# Patient Record
Sex: Male | Born: 1935 | Race: White | Hispanic: No | Marital: Married | Smoking: Never smoker
Health system: Southern US, Community
[De-identification: ages and names within clinical notes are randomized; demographics above are authoritative.]

## PROBLEM LIST (undated history)

## (undated) DIAGNOSIS — I10 Essential (primary) hypertension: Secondary | ICD-10-CM

## (undated) DIAGNOSIS — I493 Ventricular premature depolarization: Secondary | ICD-10-CM

## (undated) DIAGNOSIS — E7800 Pure hypercholesterolemia, unspecified: Secondary | ICD-10-CM

## (undated) HISTORY — PX: FOOT SURGERY: SHX648

## (undated) HISTORY — DX: Ventricular premature depolarization: I49.3

## (undated) HISTORY — DX: Essential (primary) hypertension: I10

## (undated) HISTORY — DX: Pure hypercholesterolemia, unspecified: E78.00

---
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
## 1966-05-21 HISTORY — PX: VASECTOMY: SHX75

## 1994-05-21 HISTORY — PX: ROTATOR CUFF REPAIR: SHX139

## 2001-05-21 DIAGNOSIS — M5412 Radiculopathy, cervical region: Secondary | ICD-10-CM

## 2001-05-21 DIAGNOSIS — M47814 Spondylosis without myelopathy or radiculopathy, thoracic region: Secondary | ICD-10-CM | POA: Insufficient documentation

## 2001-05-21 HISTORY — DX: Radiculopathy, cervical region: M54.12

## 2003-06-04 ENCOUNTER — Encounter: Admission: RE | Admit: 2003-06-04 | Discharge: 2003-06-04 | Payer: Self-pay | Admitting: Internal Medicine

## 2003-07-08 ENCOUNTER — Encounter: Admission: RE | Admit: 2003-07-08 | Discharge: 2003-07-08 | Payer: Self-pay | Admitting: Neurosurgery

## 2003-07-23 ENCOUNTER — Encounter: Admission: RE | Admit: 2003-07-23 | Discharge: 2003-07-23 | Payer: Self-pay | Admitting: Neurosurgery

## 2004-05-21 DIAGNOSIS — Z9889 Other specified postprocedural states: Secondary | ICD-10-CM | POA: Insufficient documentation

## 2004-05-21 DIAGNOSIS — M48061 Spinal stenosis, lumbar region without neurogenic claudication: Secondary | ICD-10-CM | POA: Insufficient documentation

## 2004-05-21 DIAGNOSIS — M5416 Radiculopathy, lumbar region: Secondary | ICD-10-CM

## 2004-05-21 HISTORY — DX: Radiculopathy, lumbar region: M54.16

## 2004-05-21 HISTORY — DX: Other specified postprocedural states: Z98.890

## 2009-02-01 ENCOUNTER — Encounter: Admission: RE | Admit: 2009-02-01 | Discharge: 2009-02-01 | Payer: Self-pay | Admitting: Internal Medicine

## 2009-02-23 ENCOUNTER — Encounter: Admission: RE | Admit: 2009-02-23 | Discharge: 2009-02-23 | Payer: Self-pay | Admitting: Neurosurgery

## 2009-03-11 ENCOUNTER — Encounter: Admission: RE | Admit: 2009-03-11 | Discharge: 2009-03-11 | Payer: Self-pay | Admitting: Neurosurgery

## 2009-05-21 HISTORY — PX: CERVICAL DISCECTOMY: SHX98

## 2009-05-27 ENCOUNTER — Inpatient Hospital Stay (HOSPITAL_COMMUNITY): Admission: RE | Admit: 2009-05-27 | Discharge: 2009-05-28 | Payer: Self-pay | Admitting: Neurosurgery

## 2010-05-21 HISTORY — PX: ELBOW ARTHROPLASTY: SHX928

## 2010-08-06 LAB — COMPREHENSIVE METABOLIC PANEL
ALT: 53 U/L (ref 0–53)
AST: 39 U/L — ABNORMAL HIGH (ref 0–37)
Albumin: 4 g/dL (ref 3.5–5.2)
Alkaline Phosphatase: 73 U/L (ref 39–117)
BUN: 10 mg/dL (ref 6–23)
CO2: 29 mEq/L (ref 19–32)
Calcium: 9 mg/dL (ref 8.4–10.5)
Chloride: 102 mEq/L (ref 96–112)
Creatinine, Ser: 1 mg/dL (ref 0.4–1.5)
GFR calc Af Amer: >60 mL/min (ref >60)
GFR calc non Af Amer: >60 mL/min (ref >60)
Glucose, Bld: 108 mg/dL — ABNORMAL HIGH (ref 70–99)
Potassium: 3.6 mEq/L (ref 3.5–5.1)
Sodium: 138 mEq/L (ref 135–145)
Total Bilirubin: 1.5 mg/dL — ABNORMAL HIGH (ref 0.3–1.2)
Total Protein: 6.5 g/dL (ref 6.0–8.3)

## 2010-08-06 LAB — DIFFERENTIAL
Basophils Absolute: 0 10*3/uL (ref 0.0–0.1)
Basophils Relative: 1 % (ref 0–1)
Eosinophils Absolute: 0.2 10*3/uL (ref 0.0–0.7)
Eosinophils Relative: 2 % (ref 0–5)
Lymphocytes Relative: 24 % (ref 12–46)
Lymphs Abs: 2 10*3/uL (ref 0.7–4.0)
Monocytes Absolute: 0.7 10*3/uL (ref 0.1–1.0)
Monocytes Relative: 8 % (ref 3–12)
Neutro Abs: 5.5 10*3/uL (ref 1.7–7.7)
Neutrophils Relative %: 65 % (ref 43–77)

## 2010-08-06 LAB — CBC
HCT: 45.3 % (ref 39.0–52.0)
Hemoglobin: 15.7 g/dL (ref 13.0–17.0)
MCHC: 34.7 g/dL (ref 30.0–36.0)
MCV: 89.7 fL (ref 78.0–100.0)
Platelets: 158 10*3/uL (ref 150–400)
RBC: 5.05 MIL/uL (ref 4.22–5.81)
RDW: 13.9 % (ref 11.5–15.5)
WBC: 8.4 10*3/uL (ref 4.0–10.5)

## 2010-08-06 LAB — URINALYSIS, ROUTINE W REFLEX MICROSCOPIC
Bilirubin Urine: NEGATIVE
Glucose, UA: NEGATIVE mg/dL
Hgb urine dipstick: NEGATIVE
Ketones, ur: NEGATIVE mg/dL
Nitrite: NEGATIVE
Protein, ur: NEGATIVE mg/dL
Specific Gravity, Urine: 1.016 (ref 1.005–1.030)
Urobilinogen, UA: 1 mg/dL (ref 0.0–1.0)
pH: 7 (ref 5.0–8.0)

## 2010-08-06 LAB — PROTIME-INR
INR: 0.99 (ref 0.00–1.49)
Prothrombin Time: 13 seconds (ref 11.6–15.2)

## 2010-08-06 LAB — APTT: aPTT: 31 seconds (ref 24–37)

## 2011-05-28 DIAGNOSIS — M75110 Incomplete rotator cuff tear or rupture of unspecified shoulder, not specified as traumatic: Secondary | ICD-10-CM | POA: Diagnosis not present

## 2011-06-28 DIAGNOSIS — R002 Palpitations: Secondary | ICD-10-CM | POA: Diagnosis not present

## 2011-07-02 DIAGNOSIS — R002 Palpitations: Secondary | ICD-10-CM | POA: Diagnosis not present

## 2011-07-05 DIAGNOSIS — R002 Palpitations: Secondary | ICD-10-CM | POA: Diagnosis not present

## 2011-07-23 DIAGNOSIS — G479 Sleep disorder, unspecified: Secondary | ICD-10-CM | POA: Diagnosis not present

## 2011-07-23 DIAGNOSIS — R002 Palpitations: Secondary | ICD-10-CM | POA: Diagnosis not present

## 2011-08-06 DIAGNOSIS — G4730 Sleep apnea, unspecified: Secondary | ICD-10-CM

## 2011-08-06 DIAGNOSIS — G4733 Obstructive sleep apnea (adult) (pediatric): Secondary | ICD-10-CM | POA: Insufficient documentation

## 2011-08-06 HISTORY — DX: Sleep apnea, unspecified: G47.30

## 2011-08-30 DIAGNOSIS — L905 Scar conditions and fibrosis of skin: Secondary | ICD-10-CM | POA: Diagnosis not present

## 2011-08-30 DIAGNOSIS — L578 Other skin changes due to chronic exposure to nonionizing radiation: Secondary | ICD-10-CM | POA: Diagnosis not present

## 2011-08-30 DIAGNOSIS — D235 Other benign neoplasm of skin of trunk: Secondary | ICD-10-CM | POA: Diagnosis not present

## 2011-08-30 DIAGNOSIS — D485 Neoplasm of uncertain behavior of skin: Secondary | ICD-10-CM | POA: Diagnosis not present

## 2011-10-10 DIAGNOSIS — G4733 Obstructive sleep apnea (adult) (pediatric): Secondary | ICD-10-CM | POA: Diagnosis not present

## 2011-10-10 DIAGNOSIS — M6200 Separation of muscle (nontraumatic), unspecified site: Secondary | ICD-10-CM | POA: Diagnosis not present

## 2011-10-10 DIAGNOSIS — K429 Umbilical hernia without obstruction or gangrene: Secondary | ICD-10-CM | POA: Diagnosis not present

## 2011-11-21 DIAGNOSIS — Z1331 Encounter for screening for depression: Secondary | ICD-10-CM | POA: Diagnosis not present

## 2011-11-21 DIAGNOSIS — G4733 Obstructive sleep apnea (adult) (pediatric): Secondary | ICD-10-CM | POA: Diagnosis not present

## 2011-11-21 DIAGNOSIS — I4949 Other premature depolarization: Secondary | ICD-10-CM | POA: Diagnosis not present

## 2011-11-21 DIAGNOSIS — E7800 Pure hypercholesterolemia, unspecified: Secondary | ICD-10-CM | POA: Diagnosis not present

## 2011-11-21 DIAGNOSIS — Z131 Encounter for screening for diabetes mellitus: Secondary | ICD-10-CM | POA: Diagnosis not present

## 2011-11-21 DIAGNOSIS — Z0000 Encounter for general adult medical examination without abnormal findings: Secondary | ICD-10-CM | POA: Diagnosis not present

## 2012-02-19 DIAGNOSIS — IMO0002 Reserved for concepts with insufficient information to code with codable children: Secondary | ICD-10-CM | POA: Diagnosis not present

## 2012-04-02 DIAGNOSIS — L820 Inflamed seborrheic keratosis: Secondary | ICD-10-CM | POA: Diagnosis not present

## 2012-04-02 DIAGNOSIS — D485 Neoplasm of uncertain behavior of skin: Secondary | ICD-10-CM | POA: Diagnosis not present

## 2012-05-06 DIAGNOSIS — N1330 Unspecified hydronephrosis: Secondary | ICD-10-CM | POA: Diagnosis not present

## 2012-05-06 DIAGNOSIS — I708 Atherosclerosis of other arteries: Secondary | ICD-10-CM | POA: Diagnosis not present

## 2012-05-06 DIAGNOSIS — I700 Atherosclerosis of aorta: Secondary | ICD-10-CM | POA: Diagnosis not present

## 2012-05-06 DIAGNOSIS — R1032 Left lower quadrant pain: Secondary | ICD-10-CM | POA: Diagnosis not present

## 2012-05-06 DIAGNOSIS — N400 Enlarged prostate without lower urinary tract symptoms: Secondary | ICD-10-CM | POA: Diagnosis not present

## 2012-05-06 DIAGNOSIS — N201 Calculus of ureter: Secondary | ICD-10-CM | POA: Diagnosis not present

## 2012-05-06 DIAGNOSIS — G4733 Obstructive sleep apnea (adult) (pediatric): Secondary | ICD-10-CM | POA: Diagnosis not present

## 2012-05-06 DIAGNOSIS — I4460 Unspecified fascicular block: Secondary | ICD-10-CM | POA: Diagnosis not present

## 2012-05-06 DIAGNOSIS — I491 Atrial premature depolarization: Secondary | ICD-10-CM | POA: Diagnosis not present

## 2012-05-06 DIAGNOSIS — M47814 Spondylosis without myelopathy or radiculopathy, thoracic region: Secondary | ICD-10-CM | POA: Diagnosis not present

## 2012-05-06 DIAGNOSIS — M47817 Spondylosis without myelopathy or radiculopathy, lumbosacral region: Secondary | ICD-10-CM | POA: Diagnosis not present

## 2012-05-06 DIAGNOSIS — K5730 Diverticulosis of large intestine without perforation or abscess without bleeding: Secondary | ICD-10-CM | POA: Diagnosis not present

## 2012-05-06 DIAGNOSIS — K4090 Unilateral inguinal hernia, without obstruction or gangrene, not specified as recurrent: Secondary | ICD-10-CM | POA: Diagnosis not present

## 2012-05-07 DIAGNOSIS — N200 Calculus of kidney: Secondary | ICD-10-CM | POA: Diagnosis not present

## 2012-05-08 DIAGNOSIS — N400 Enlarged prostate without lower urinary tract symptoms: Secondary | ICD-10-CM | POA: Diagnosis not present

## 2012-05-08 DIAGNOSIS — N201 Calculus of ureter: Secondary | ICD-10-CM | POA: Diagnosis not present

## 2012-05-21 DIAGNOSIS — N200 Calculus of kidney: Secondary | ICD-10-CM | POA: Insufficient documentation

## 2012-05-21 DIAGNOSIS — N1330 Unspecified hydronephrosis: Secondary | ICD-10-CM | POA: Insufficient documentation

## 2012-05-21 HISTORY — DX: Calculus of kidney: N20.0

## 2012-05-29 DIAGNOSIS — N201 Calculus of ureter: Secondary | ICD-10-CM | POA: Diagnosis not present

## 2012-05-29 DIAGNOSIS — N400 Enlarged prostate without lower urinary tract symptoms: Secondary | ICD-10-CM | POA: Diagnosis not present

## 2012-05-29 DIAGNOSIS — N200 Calculus of kidney: Secondary | ICD-10-CM | POA: Diagnosis not present

## 2012-11-24 DIAGNOSIS — G4733 Obstructive sleep apnea (adult) (pediatric): Secondary | ICD-10-CM | POA: Diagnosis not present

## 2012-11-24 DIAGNOSIS — Z0000 Encounter for general adult medical examination without abnormal findings: Secondary | ICD-10-CM | POA: Diagnosis not present

## 2012-11-24 DIAGNOSIS — Z131 Encounter for screening for diabetes mellitus: Secondary | ICD-10-CM | POA: Diagnosis not present

## 2012-11-24 DIAGNOSIS — N529 Male erectile dysfunction, unspecified: Secondary | ICD-10-CM | POA: Diagnosis not present

## 2012-11-24 DIAGNOSIS — E7800 Pure hypercholesterolemia, unspecified: Secondary | ICD-10-CM | POA: Diagnosis not present

## 2012-11-24 DIAGNOSIS — Z1331 Encounter for screening for depression: Secondary | ICD-10-CM | POA: Diagnosis not present

## 2012-12-29 DIAGNOSIS — H2510 Age-related nuclear cataract, unspecified eye: Secondary | ICD-10-CM | POA: Diagnosis not present

## 2013-05-21 HISTORY — PX: ROTATOR CUFF REPAIR: SHX139

## 2013-11-16 DIAGNOSIS — D235 Other benign neoplasm of skin of trunk: Secondary | ICD-10-CM | POA: Diagnosis not present

## 2013-11-16 DIAGNOSIS — L570 Actinic keratosis: Secondary | ICD-10-CM | POA: Diagnosis not present

## 2013-11-16 DIAGNOSIS — D485 Neoplasm of uncertain behavior of skin: Secondary | ICD-10-CM | POA: Diagnosis not present

## 2013-11-16 DIAGNOSIS — D1801 Hemangioma of skin and subcutaneous tissue: Secondary | ICD-10-CM | POA: Diagnosis not present

## 2013-12-22 DIAGNOSIS — G4733 Obstructive sleep apnea (adult) (pediatric): Secondary | ICD-10-CM | POA: Diagnosis not present

## 2013-12-22 DIAGNOSIS — E7800 Pure hypercholesterolemia, unspecified: Secondary | ICD-10-CM | POA: Diagnosis not present

## 2013-12-22 DIAGNOSIS — Z1331 Encounter for screening for depression: Secondary | ICD-10-CM | POA: Diagnosis not present

## 2013-12-22 DIAGNOSIS — N529 Male erectile dysfunction, unspecified: Secondary | ICD-10-CM | POA: Diagnosis not present

## 2013-12-22 DIAGNOSIS — Z23 Encounter for immunization: Secondary | ICD-10-CM | POA: Diagnosis not present

## 2013-12-22 DIAGNOSIS — Z0000 Encounter for general adult medical examination without abnormal findings: Secondary | ICD-10-CM | POA: Diagnosis not present

## 2013-12-22 DIAGNOSIS — Z131 Encounter for screening for diabetes mellitus: Secondary | ICD-10-CM | POA: Diagnosis not present

## 2014-04-19 DIAGNOSIS — H00012 Hordeolum externum right lower eyelid: Secondary | ICD-10-CM | POA: Diagnosis not present

## 2014-05-31 ENCOUNTER — Other Ambulatory Visit: Payer: Self-pay | Admitting: Internal Medicine

## 2014-05-31 DIAGNOSIS — G4733 Obstructive sleep apnea (adult) (pediatric): Secondary | ICD-10-CM | POA: Diagnosis not present

## 2014-05-31 DIAGNOSIS — M5416 Radiculopathy, lumbar region: Secondary | ICD-10-CM

## 2014-05-31 DIAGNOSIS — M545 Low back pain: Secondary | ICD-10-CM | POA: Diagnosis not present

## 2014-06-06 ENCOUNTER — Ambulatory Visit
Admission: RE | Admit: 2014-06-06 | Discharge: 2014-06-06 | Disposition: A | Discharge status: Home / Self Care | Payer: Medicare Other | Source: Ambulatory Visit | Attending: Internal Medicine | Admitting: Internal Medicine

## 2014-06-06 DIAGNOSIS — M47816 Spondylosis without myelopathy or radiculopathy, lumbar region: Secondary | ICD-10-CM | POA: Diagnosis not present

## 2014-06-06 DIAGNOSIS — M5416 Radiculopathy, lumbar region: Secondary | ICD-10-CM

## 2014-06-21 DIAGNOSIS — H25013 Cortical age-related cataract, bilateral: Secondary | ICD-10-CM | POA: Diagnosis not present

## 2014-06-21 DIAGNOSIS — H2513 Age-related nuclear cataract, bilateral: Secondary | ICD-10-CM | POA: Diagnosis not present

## 2014-06-21 DIAGNOSIS — H01012 Ulcerative blepharitis right lower eyelid: Secondary | ICD-10-CM | POA: Diagnosis not present

## 2014-07-12 DIAGNOSIS — H01012 Ulcerative blepharitis right lower eyelid: Secondary | ICD-10-CM | POA: Diagnosis not present

## 2014-08-10 DIAGNOSIS — H01015 Ulcerative blepharitis left lower eyelid: Secondary | ICD-10-CM | POA: Diagnosis not present

## 2014-08-10 DIAGNOSIS — H01012 Ulcerative blepharitis right lower eyelid: Secondary | ICD-10-CM | POA: Diagnosis not present

## 2014-08-20 DIAGNOSIS — M47816 Spondylosis without myelopathy or radiculopathy, lumbar region: Secondary | ICD-10-CM | POA: Diagnosis not present

## 2014-09-17 DIAGNOSIS — M545 Low back pain: Secondary | ICD-10-CM | POA: Diagnosis not present

## 2014-09-17 DIAGNOSIS — M47816 Spondylosis without myelopathy or radiculopathy, lumbar region: Secondary | ICD-10-CM | POA: Diagnosis not present

## 2014-09-20 DIAGNOSIS — G8929 Other chronic pain: Secondary | ICD-10-CM | POA: Diagnosis not present

## 2014-09-20 DIAGNOSIS — Z885 Allergy status to narcotic agent status: Secondary | ICD-10-CM | POA: Diagnosis not present

## 2014-09-20 DIAGNOSIS — M47816 Spondylosis without myelopathy or radiculopathy, lumbar region: Secondary | ICD-10-CM | POA: Diagnosis not present

## 2014-09-20 DIAGNOSIS — Z79899 Other long term (current) drug therapy: Secondary | ICD-10-CM | POA: Diagnosis not present

## 2014-09-20 DIAGNOSIS — Z7982 Long term (current) use of aspirin: Secondary | ICD-10-CM | POA: Diagnosis not present

## 2014-09-20 DIAGNOSIS — Z888 Allergy status to other drugs, medicaments and biological substances status: Secondary | ICD-10-CM | POA: Diagnosis not present

## 2014-10-14 DIAGNOSIS — Z79899 Other long term (current) drug therapy: Secondary | ICD-10-CM | POA: Diagnosis not present

## 2014-10-14 DIAGNOSIS — Z7982 Long term (current) use of aspirin: Secondary | ICD-10-CM | POA: Diagnosis not present

## 2014-10-14 DIAGNOSIS — M47816 Spondylosis without myelopathy or radiculopathy, lumbar region: Secondary | ICD-10-CM | POA: Diagnosis not present

## 2014-10-14 DIAGNOSIS — G8929 Other chronic pain: Secondary | ICD-10-CM | POA: Diagnosis not present

## 2014-10-14 DIAGNOSIS — Z885 Allergy status to narcotic agent status: Secondary | ICD-10-CM | POA: Diagnosis not present

## 2014-10-14 DIAGNOSIS — Z888 Allergy status to other drugs, medicaments and biological substances status: Secondary | ICD-10-CM | POA: Diagnosis not present

## 2014-10-19 DIAGNOSIS — M47816 Spondylosis without myelopathy or radiculopathy, lumbar region: Secondary | ICD-10-CM | POA: Diagnosis not present

## 2014-10-20 DIAGNOSIS — L821 Other seborrheic keratosis: Secondary | ICD-10-CM | POA: Diagnosis not present

## 2014-10-20 DIAGNOSIS — L820 Inflamed seborrheic keratosis: Secondary | ICD-10-CM | POA: Diagnosis not present

## 2014-10-20 DIAGNOSIS — L700 Acne vulgaris: Secondary | ICD-10-CM | POA: Diagnosis not present

## 2014-10-20 DIAGNOSIS — D225 Melanocytic nevi of trunk: Secondary | ICD-10-CM | POA: Diagnosis not present

## 2014-10-20 DIAGNOSIS — L570 Actinic keratosis: Secondary | ICD-10-CM | POA: Diagnosis not present

## 2014-11-17 DIAGNOSIS — M47816 Spondylosis without myelopathy or radiculopathy, lumbar region: Secondary | ICD-10-CM | POA: Diagnosis not present

## 2014-11-17 DIAGNOSIS — M545 Low back pain: Secondary | ICD-10-CM | POA: Diagnosis not present

## 2014-12-09 DIAGNOSIS — H01015 Ulcerative blepharitis left lower eyelid: Secondary | ICD-10-CM | POA: Diagnosis not present

## 2014-12-09 DIAGNOSIS — H01012 Ulcerative blepharitis right lower eyelid: Secondary | ICD-10-CM | POA: Diagnosis not present

## 2015-01-13 DIAGNOSIS — M47816 Spondylosis without myelopathy or radiculopathy, lumbar region: Secondary | ICD-10-CM | POA: Diagnosis not present

## 2015-01-13 DIAGNOSIS — Z885 Allergy status to narcotic agent status: Secondary | ICD-10-CM | POA: Diagnosis not present

## 2015-01-13 DIAGNOSIS — M545 Low back pain: Secondary | ICD-10-CM | POA: Diagnosis not present

## 2015-01-13 DIAGNOSIS — Z888 Allergy status to other drugs, medicaments and biological substances status: Secondary | ICD-10-CM | POA: Diagnosis not present

## 2015-01-13 DIAGNOSIS — Z7982 Long term (current) use of aspirin: Secondary | ICD-10-CM | POA: Diagnosis not present

## 2015-01-13 DIAGNOSIS — G8929 Other chronic pain: Secondary | ICD-10-CM | POA: Diagnosis not present

## 2015-02-04 DIAGNOSIS — M47816 Spondylosis without myelopathy or radiculopathy, lumbar region: Secondary | ICD-10-CM | POA: Diagnosis not present

## 2015-02-04 DIAGNOSIS — Z885 Allergy status to narcotic agent status: Secondary | ICD-10-CM | POA: Diagnosis not present

## 2015-02-04 DIAGNOSIS — Z888 Allergy status to other drugs, medicaments and biological substances status: Secondary | ICD-10-CM | POA: Diagnosis not present

## 2015-02-04 DIAGNOSIS — M545 Low back pain: Secondary | ICD-10-CM | POA: Diagnosis not present

## 2015-02-04 DIAGNOSIS — Z7982 Long term (current) use of aspirin: Secondary | ICD-10-CM | POA: Diagnosis not present

## 2015-02-04 DIAGNOSIS — G8929 Other chronic pain: Secondary | ICD-10-CM | POA: Diagnosis not present

## 2015-02-21 DIAGNOSIS — G4733 Obstructive sleep apnea (adult) (pediatric): Secondary | ICD-10-CM | POA: Diagnosis not present

## 2015-02-21 DIAGNOSIS — Z0000 Encounter for general adult medical examination without abnormal findings: Secondary | ICD-10-CM | POA: Diagnosis not present

## 2015-02-21 DIAGNOSIS — Z1389 Encounter for screening for other disorder: Secondary | ICD-10-CM | POA: Diagnosis not present

## 2015-02-21 DIAGNOSIS — M545 Low back pain: Secondary | ICD-10-CM | POA: Diagnosis not present

## 2015-02-21 DIAGNOSIS — E782 Mixed hyperlipidemia: Secondary | ICD-10-CM | POA: Diagnosis not present

## 2015-02-23 DIAGNOSIS — M47816 Spondylosis without myelopathy or radiculopathy, lumbar region: Secondary | ICD-10-CM | POA: Diagnosis not present

## 2015-02-23 DIAGNOSIS — M545 Low back pain: Secondary | ICD-10-CM | POA: Diagnosis not present

## 2015-04-21 DIAGNOSIS — I10 Essential (primary) hypertension: Secondary | ICD-10-CM | POA: Diagnosis not present

## 2015-04-28 DIAGNOSIS — I10 Essential (primary) hypertension: Secondary | ICD-10-CM | POA: Diagnosis not present

## 2015-05-03 DIAGNOSIS — D122 Benign neoplasm of ascending colon: Secondary | ICD-10-CM | POA: Diagnosis not present

## 2015-05-03 DIAGNOSIS — Z09 Encounter for follow-up examination after completed treatment for conditions other than malignant neoplasm: Secondary | ICD-10-CM | POA: Diagnosis not present

## 2015-05-03 DIAGNOSIS — Z86010 Personal history of colonic polyps: Secondary | ICD-10-CM | POA: Diagnosis not present

## 2015-05-03 DIAGNOSIS — D120 Benign neoplasm of cecum: Secondary | ICD-10-CM | POA: Diagnosis not present

## 2015-05-03 DIAGNOSIS — Z1211 Encounter for screening for malignant neoplasm of colon: Secondary | ICD-10-CM | POA: Diagnosis not present

## 2015-05-03 DIAGNOSIS — K5730 Diverticulosis of large intestine without perforation or abscess without bleeding: Secondary | ICD-10-CM | POA: Diagnosis not present

## 2015-05-03 DIAGNOSIS — D126 Benign neoplasm of colon, unspecified: Secondary | ICD-10-CM | POA: Diagnosis not present

## 2015-05-22 HISTORY — PX: CATARACT EXTRACTION, BILATERAL: SHX1313

## 2015-05-30 DIAGNOSIS — B078 Other viral warts: Secondary | ICD-10-CM | POA: Diagnosis not present

## 2015-05-30 DIAGNOSIS — D225 Melanocytic nevi of trunk: Secondary | ICD-10-CM | POA: Diagnosis not present

## 2015-05-30 DIAGNOSIS — D1801 Hemangioma of skin and subcutaneous tissue: Secondary | ICD-10-CM | POA: Diagnosis not present

## 2015-05-30 DIAGNOSIS — L814 Other melanin hyperpigmentation: Secondary | ICD-10-CM | POA: Diagnosis not present

## 2015-05-30 DIAGNOSIS — L821 Other seborrheic keratosis: Secondary | ICD-10-CM | POA: Diagnosis not present

## 2015-05-30 DIAGNOSIS — L570 Actinic keratosis: Secondary | ICD-10-CM | POA: Diagnosis not present

## 2015-06-14 DIAGNOSIS — K1370 Unspecified lesions of oral mucosa: Secondary | ICD-10-CM | POA: Diagnosis not present

## 2015-06-24 DIAGNOSIS — H2513 Age-related nuclear cataract, bilateral: Secondary | ICD-10-CM | POA: Diagnosis not present

## 2015-06-24 DIAGNOSIS — H25013 Cortical age-related cataract, bilateral: Secondary | ICD-10-CM | POA: Diagnosis not present

## 2015-06-24 DIAGNOSIS — H01015 Ulcerative blepharitis left lower eyelid: Secondary | ICD-10-CM | POA: Diagnosis not present

## 2015-06-24 DIAGNOSIS — H01012 Ulcerative blepharitis right lower eyelid: Secondary | ICD-10-CM | POA: Diagnosis not present

## 2015-07-29 DIAGNOSIS — H3563 Retinal hemorrhage, bilateral: Secondary | ICD-10-CM | POA: Diagnosis not present

## 2015-07-29 DIAGNOSIS — H43813 Vitreous degeneration, bilateral: Secondary | ICD-10-CM | POA: Diagnosis not present

## 2015-07-29 DIAGNOSIS — H34832 Tributary (branch) retinal vein occlusion, left eye, with macular edema: Secondary | ICD-10-CM | POA: Diagnosis not present

## 2015-08-24 DIAGNOSIS — M47816 Spondylosis without myelopathy or radiculopathy, lumbar region: Secondary | ICD-10-CM | POA: Diagnosis not present

## 2015-08-24 DIAGNOSIS — M545 Low back pain: Secondary | ICD-10-CM | POA: Diagnosis not present

## 2015-11-02 DIAGNOSIS — M25522 Pain in left elbow: Secondary | ICD-10-CM | POA: Diagnosis not present

## 2015-11-02 DIAGNOSIS — M7022 Olecranon bursitis, left elbow: Secondary | ICD-10-CM | POA: Diagnosis not present

## 2015-11-07 DIAGNOSIS — H34832 Tributary (branch) retinal vein occlusion, left eye, with macular edema: Secondary | ICD-10-CM | POA: Diagnosis not present

## 2015-11-07 DIAGNOSIS — H43813 Vitreous degeneration, bilateral: Secondary | ICD-10-CM | POA: Diagnosis not present

## 2015-11-18 DIAGNOSIS — M7022 Olecranon bursitis, left elbow: Secondary | ICD-10-CM | POA: Diagnosis not present

## 2015-12-05 DIAGNOSIS — G8918 Other acute postprocedural pain: Secondary | ICD-10-CM | POA: Diagnosis not present

## 2015-12-05 DIAGNOSIS — M7022 Olecranon bursitis, left elbow: Secondary | ICD-10-CM | POA: Diagnosis not present

## 2015-12-19 DIAGNOSIS — M7022 Olecranon bursitis, left elbow: Secondary | ICD-10-CM | POA: Diagnosis not present

## 2015-12-19 DIAGNOSIS — Z4789 Encounter for other orthopedic aftercare: Secondary | ICD-10-CM | POA: Diagnosis not present

## 2016-01-03 DIAGNOSIS — M7022 Olecranon bursitis, left elbow: Secondary | ICD-10-CM | POA: Diagnosis not present

## 2016-01-03 DIAGNOSIS — Z4789 Encounter for other orthopedic aftercare: Secondary | ICD-10-CM | POA: Diagnosis not present

## 2016-01-13 DIAGNOSIS — H2513 Age-related nuclear cataract, bilateral: Secondary | ICD-10-CM | POA: Diagnosis not present

## 2016-02-03 DIAGNOSIS — M7022 Olecranon bursitis, left elbow: Secondary | ICD-10-CM | POA: Diagnosis not present

## 2016-02-03 DIAGNOSIS — Z4789 Encounter for other orthopedic aftercare: Secondary | ICD-10-CM | POA: Diagnosis not present

## 2016-02-29 DIAGNOSIS — D1801 Hemangioma of skin and subcutaneous tissue: Secondary | ICD-10-CM | POA: Diagnosis not present

## 2016-02-29 DIAGNOSIS — D225 Melanocytic nevi of trunk: Secondary | ICD-10-CM | POA: Diagnosis not present

## 2016-02-29 DIAGNOSIS — L820 Inflamed seborrheic keratosis: Secondary | ICD-10-CM | POA: Diagnosis not present

## 2016-02-29 DIAGNOSIS — L821 Other seborrheic keratosis: Secondary | ICD-10-CM | POA: Diagnosis not present

## 2016-02-29 DIAGNOSIS — L570 Actinic keratosis: Secondary | ICD-10-CM | POA: Diagnosis not present

## 2016-03-09 DIAGNOSIS — E7800 Pure hypercholesterolemia, unspecified: Secondary | ICD-10-CM | POA: Diagnosis not present

## 2016-03-09 DIAGNOSIS — Z0000 Encounter for general adult medical examination without abnormal findings: Secondary | ICD-10-CM | POA: Diagnosis not present

## 2016-03-09 DIAGNOSIS — Z1389 Encounter for screening for other disorder: Secondary | ICD-10-CM | POA: Diagnosis not present

## 2016-03-09 DIAGNOSIS — I10 Essential (primary) hypertension: Secondary | ICD-10-CM | POA: Diagnosis not present

## 2016-05-10 DIAGNOSIS — H34832 Tributary (branch) retinal vein occlusion, left eye, with macular edema: Secondary | ICD-10-CM | POA: Diagnosis not present

## 2016-05-10 DIAGNOSIS — H43813 Vitreous degeneration, bilateral: Secondary | ICD-10-CM | POA: Diagnosis not present

## 2016-06-26 DIAGNOSIS — H01012 Ulcerative blepharitis right lower eyelid: Secondary | ICD-10-CM | POA: Diagnosis not present

## 2016-06-26 DIAGNOSIS — H01015 Ulcerative blepharitis left lower eyelid: Secondary | ICD-10-CM | POA: Diagnosis not present

## 2016-06-26 DIAGNOSIS — H3561 Retinal hemorrhage, right eye: Secondary | ICD-10-CM | POA: Diagnosis not present

## 2016-06-26 DIAGNOSIS — H2513 Age-related nuclear cataract, bilateral: Secondary | ICD-10-CM | POA: Diagnosis not present

## 2016-07-31 DIAGNOSIS — M545 Low back pain: Secondary | ICD-10-CM | POA: Diagnosis not present

## 2016-07-31 DIAGNOSIS — M47817 Spondylosis without myelopathy or radiculopathy, lumbosacral region: Secondary | ICD-10-CM | POA: Diagnosis not present

## 2016-08-23 DIAGNOSIS — M47817 Spondylosis without myelopathy or radiculopathy, lumbosacral region: Secondary | ICD-10-CM | POA: Diagnosis not present

## 2016-08-23 DIAGNOSIS — M545 Low back pain: Secondary | ICD-10-CM | POA: Diagnosis not present

## 2016-09-12 DIAGNOSIS — D1809 Hemangioma of other sites: Secondary | ICD-10-CM | POA: Diagnosis not present

## 2016-09-20 DIAGNOSIS — D1809 Hemangioma of other sites: Secondary | ICD-10-CM | POA: Diagnosis not present

## 2016-09-25 DIAGNOSIS — M47817 Spondylosis without myelopathy or radiculopathy, lumbosacral region: Secondary | ICD-10-CM | POA: Diagnosis not present

## 2016-09-25 DIAGNOSIS — G894 Chronic pain syndrome: Secondary | ICD-10-CM | POA: Diagnosis not present

## 2016-11-05 DIAGNOSIS — H34832 Tributary (branch) retinal vein occlusion, left eye, with macular edema: Secondary | ICD-10-CM | POA: Diagnosis not present

## 2016-11-05 DIAGNOSIS — H35031 Hypertensive retinopathy, right eye: Secondary | ICD-10-CM | POA: Diagnosis not present

## 2016-11-05 DIAGNOSIS — H43813 Vitreous degeneration, bilateral: Secondary | ICD-10-CM | POA: Diagnosis not present

## 2016-11-05 DIAGNOSIS — H3563 Retinal hemorrhage, bilateral: Secondary | ICD-10-CM | POA: Diagnosis not present

## 2016-11-14 IMAGING — MR MR LUMBAR SPINE W/O CM
5 series · 35 of 48 positions shown · non-contrast
Comparison: Lumbar MRI 02/01/2009.

CLINICAL DATA: 83-year-old male with chronic low back pain, new
acute pain and numbness radiating to the left hip and anterior lower
extremity for 2 months. Initial encounter.

EXAM:
MRI LUMBAR SPINE WITHOUT CONTRAST
TECHNIQUE: Multiplanar, multisequence MR imaging of the lumbar spine was
performed. No intravenous contrast was administered.

[Series 4: T2 · sagittal · 4.0mm · 0.49mm/px · 6 of 13 slices shown (1 of 2)]
[im 1/13]
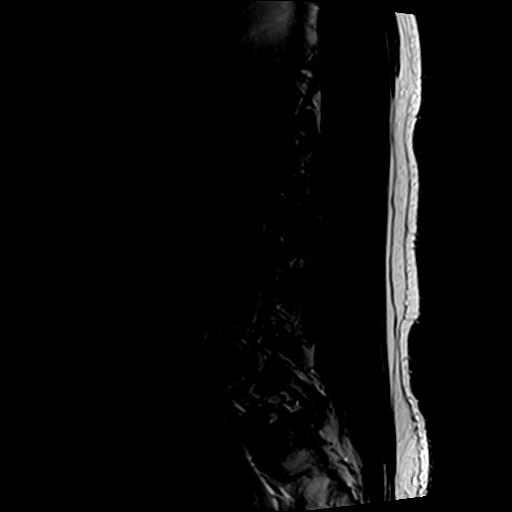
[im 3/13]
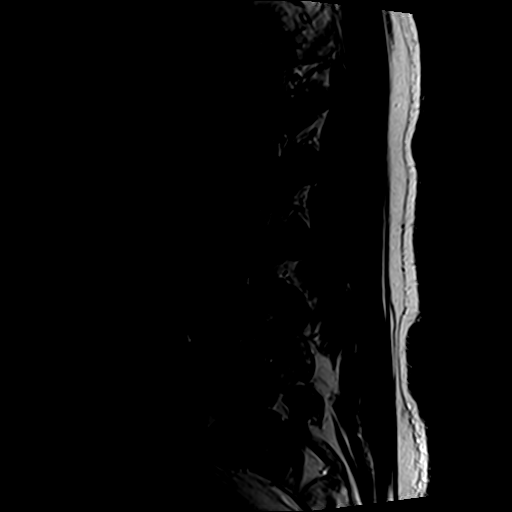
[im 5/13]
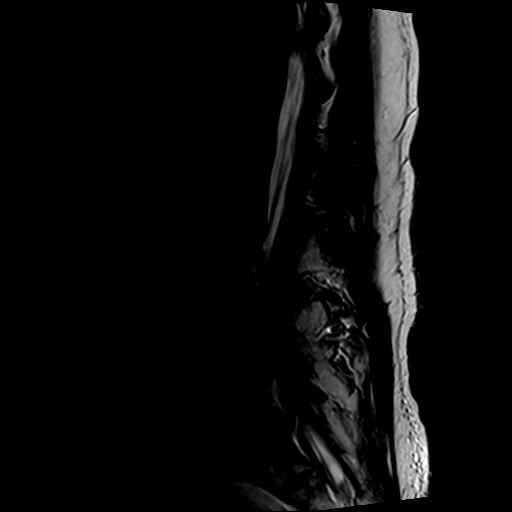
[im 8/13]
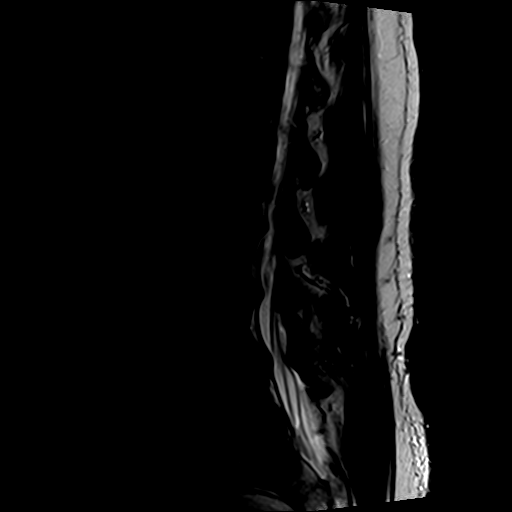
[im 10/13]
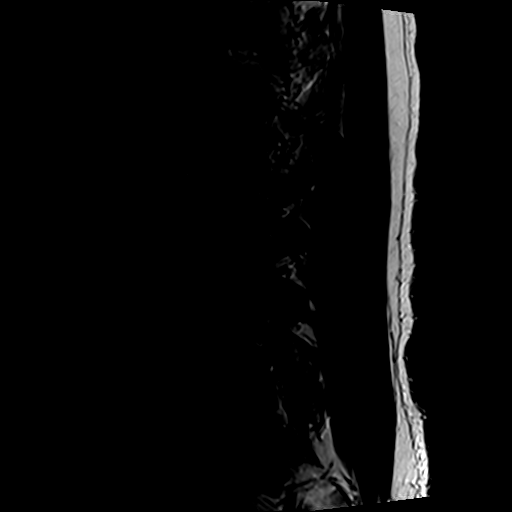
[im 13/13]
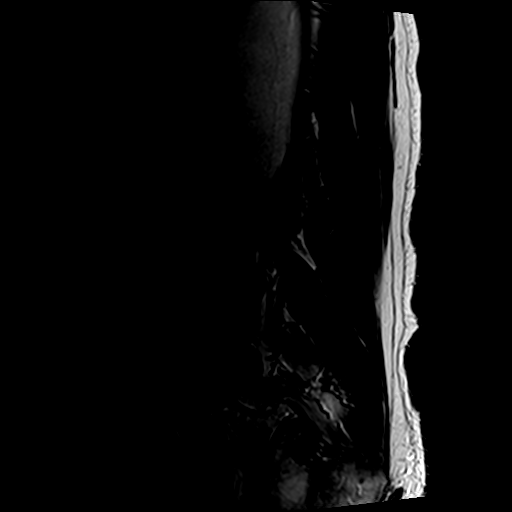

[Series 5: T1 · sagittal · 4.0mm · 0.98mm/px · 6 of 13 slices shown (1 of 2)]
[im 1/13]
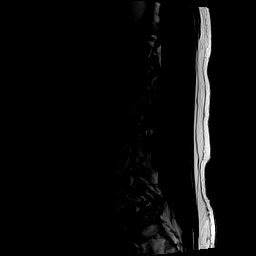
[im 3/13]
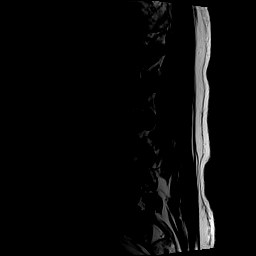
[im 5/13]
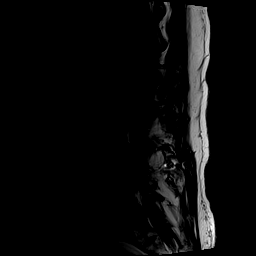
[im 8/13]
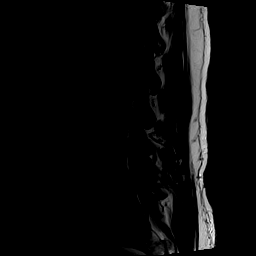
[im 10/13]
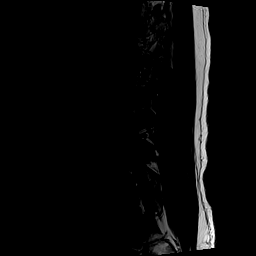
[im 13/13]
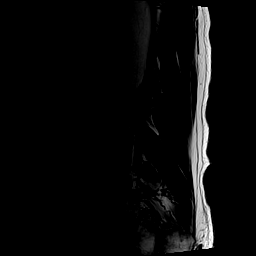

[Series 6: STIR · sagittal · 4.0mm · 0.49mm/px · 5 of 13 slices shown]
[im 1/13]
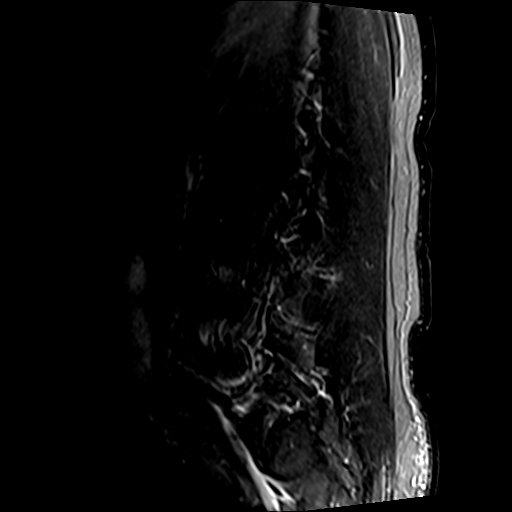
[im 3/13]
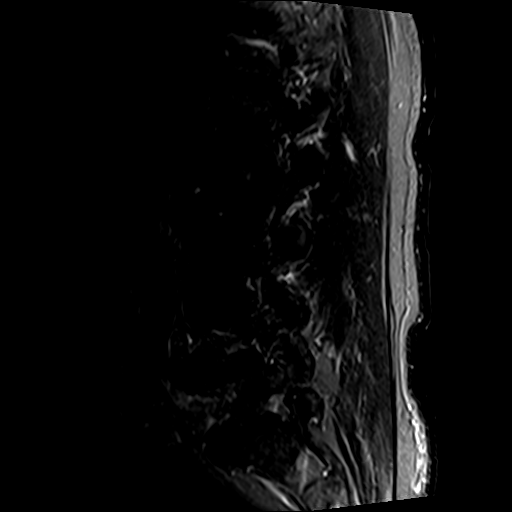
[im 5/13]
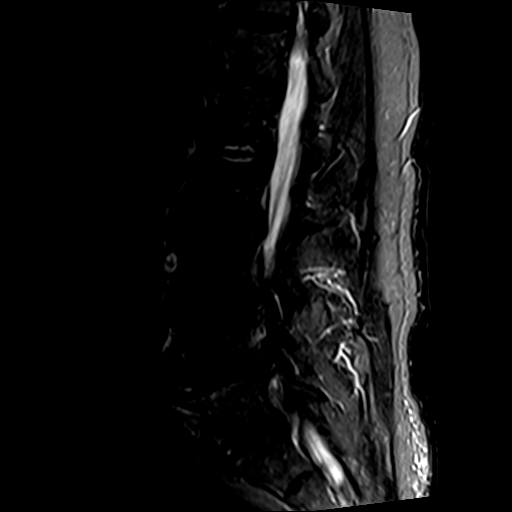
[im 8/13]
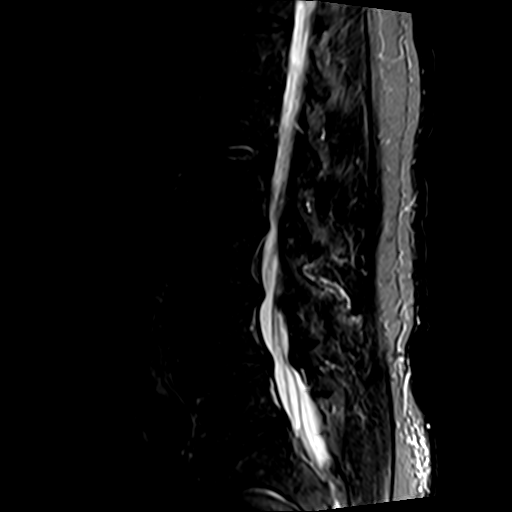
[im 10/13]
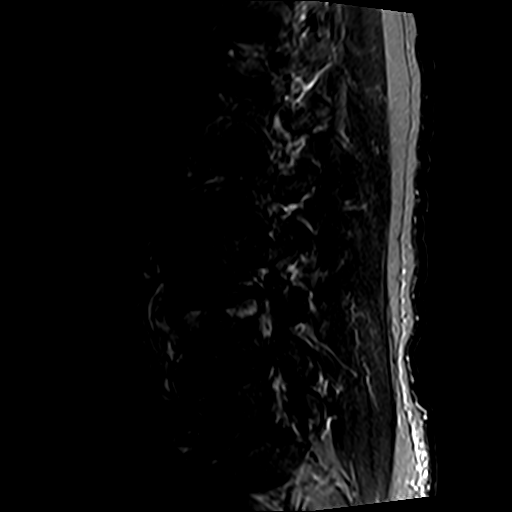

[Series 7: T2 · axial · 4.0mm · 0.74mm/px · z∈[-169,+10]mm · 9 of 32 slices shown (2 of 2)]
[im 1/32]
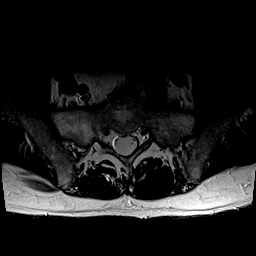
[im 5/32]
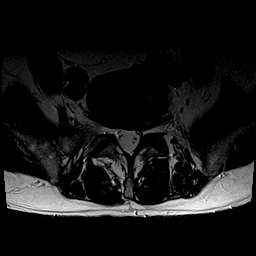
[im 9/32]
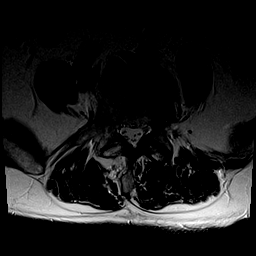
[im 14/32]
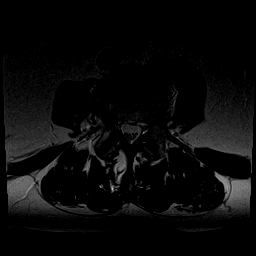
[im 16/32]
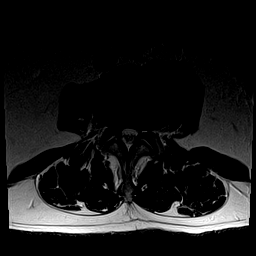
[im 18/32]
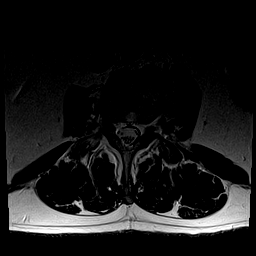
[im 23/32]
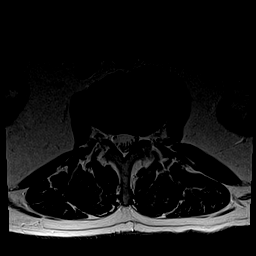
[im 27/32]
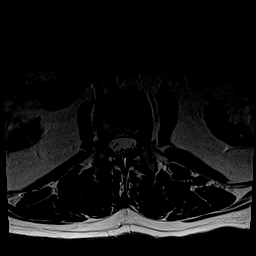
[im 32/32]
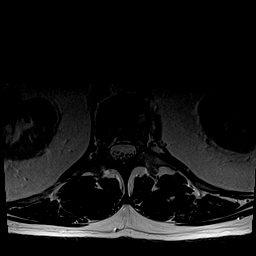

[Series 8: T1 · axial · 4.0mm · 0.74mm/px · z∈[-169,+10]mm · 9 of 32 slices shown (2 of 2)]
[im 1/32]
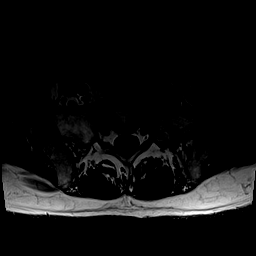
[im 5/32]
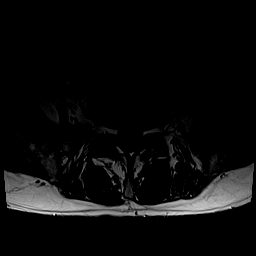
[im 9/32]
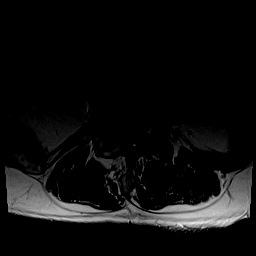
[im 14/32]
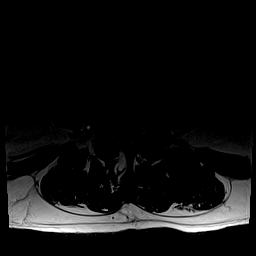
[im 16/32]
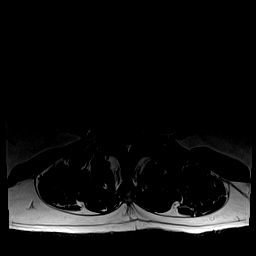
[im 18/32]
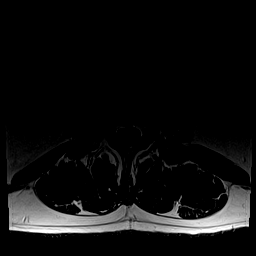
[im 23/32]
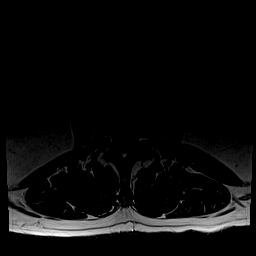
[im 27/32]
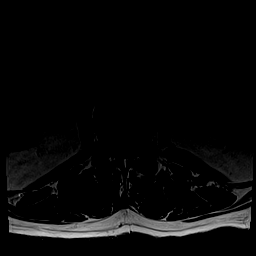
[im 32/32]
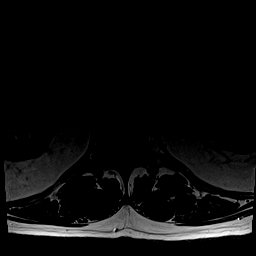

[35 of 48 positions shown; findings below may reference images not displayed]

FINDINGS: Same numbering system as in 8292. Stable vertebral height and
alignment. New mild superior endplate marrow edema at L4 on the left
(series 6, image 10) appears to be degenerative in nature. No acute
osseous abnormality identified.

Visualized lower thoracic spinal cord is normal with conus medularis
at L1.

Stable visualized abdominal viscera. Negative visualized posterior
paraspinal soft tissues.

T11-T12:  Negative.

T12-L1:  Negative.

L1-L2:  Needed.

L2-L3: Stable left eccentric disc osteophyte complex. No significant
stenosis.

L3-L4: Chronic left eccentric circumferential disc osteophyte
complex appears increased and is bulky far laterally. Broad-based
posterior component of disc. Chronic mild facet and ligament flavum
hypertrophy is stable. Still, lateral recess stenosis has increased
and is mild, greater on the right. Mild to moderate bilateral L3
foraminal stenosis has increased.

L4-L5: Interval postoperative changes to the right facet and lamina.
Chronic circumferential disc osteophyte complex. Chronic moderate
facet and ligament flavum hypertrophy. Interval improved thecal sac
and right lateral recess patency. Chronic moderate left and mild
right L4 foraminal stenosis is stable.

L5-S1: Chronic anterior eccentric bulky circumferential disc
osteophyte complex. No spinal or lateral recess stenosis. Stable
mild L5 foraminal stenosis.
IMPRESSION: 1. Postoperative changes on the right at L4-L5 since the 8292
comparison with improved thecal sac and right lateral recess patency
at that level. Residual right greater than left L4 foraminal
stenosis appears stable.
2. Chronic L3-L4 degeneration with interval increased right greater
than left lateral recess and bilateral L3 foraminal stenosis, mild
to moderate.
3. Other lumbar levels are stable.

## 2016-12-11 DIAGNOSIS — H01015 Ulcerative blepharitis left lower eyelid: Secondary | ICD-10-CM | POA: Diagnosis not present

## 2016-12-11 DIAGNOSIS — H01012 Ulcerative blepharitis right lower eyelid: Secondary | ICD-10-CM | POA: Diagnosis not present

## 2016-12-11 DIAGNOSIS — H04123 Dry eye syndrome of bilateral lacrimal glands: Secondary | ICD-10-CM | POA: Diagnosis not present

## 2016-12-11 DIAGNOSIS — H25042 Posterior subcapsular polar age-related cataract, left eye: Secondary | ICD-10-CM | POA: Diagnosis not present

## 2017-01-10 DIAGNOSIS — H25042 Posterior subcapsular polar age-related cataract, left eye: Secondary | ICD-10-CM | POA: Diagnosis not present

## 2017-01-10 DIAGNOSIS — H01012 Ulcerative blepharitis right lower eyelid: Secondary | ICD-10-CM | POA: Diagnosis not present

## 2017-02-06 DIAGNOSIS — G4733 Obstructive sleep apnea (adult) (pediatric): Secondary | ICD-10-CM | POA: Diagnosis not present

## 2017-03-22 ENCOUNTER — Other Ambulatory Visit: Payer: Self-pay | Admitting: Internal Medicine

## 2017-03-22 ENCOUNTER — Ambulatory Visit
Admission: RE | Admit: 2017-03-22 | Discharge: 2017-03-22 | Disposition: A | Discharge status: Home / Self Care | Payer: Medicare Other | Source: Ambulatory Visit | Attending: Internal Medicine | Admitting: Internal Medicine

## 2017-03-22 DIAGNOSIS — R059 Cough, unspecified: Secondary | ICD-10-CM

## 2017-03-22 DIAGNOSIS — I10 Essential (primary) hypertension: Secondary | ICD-10-CM | POA: Diagnosis not present

## 2017-03-22 DIAGNOSIS — Z1389 Encounter for screening for other disorder: Secondary | ICD-10-CM | POA: Diagnosis not present

## 2017-03-22 DIAGNOSIS — R05 Cough: Secondary | ICD-10-CM | POA: Diagnosis not present

## 2017-03-22 DIAGNOSIS — E7800 Pure hypercholesterolemia, unspecified: Secondary | ICD-10-CM | POA: Diagnosis not present

## 2017-03-22 DIAGNOSIS — Z0000 Encounter for general adult medical examination without abnormal findings: Secondary | ICD-10-CM | POA: Diagnosis not present

## 2017-03-28 DIAGNOSIS — L570 Actinic keratosis: Secondary | ICD-10-CM | POA: Diagnosis not present

## 2017-04-23 DIAGNOSIS — H02839 Dermatochalasis of unspecified eye, unspecified eyelid: Secondary | ICD-10-CM | POA: Diagnosis not present

## 2017-04-23 DIAGNOSIS — H2513 Age-related nuclear cataract, bilateral: Secondary | ICD-10-CM | POA: Diagnosis not present

## 2017-04-23 DIAGNOSIS — H25043 Posterior subcapsular polar age-related cataract, bilateral: Secondary | ICD-10-CM | POA: Diagnosis not present

## 2017-04-23 DIAGNOSIS — H2512 Age-related nuclear cataract, left eye: Secondary | ICD-10-CM | POA: Diagnosis not present

## 2017-04-23 DIAGNOSIS — H25013 Cortical age-related cataract, bilateral: Secondary | ICD-10-CM | POA: Diagnosis not present

## 2017-05-07 DIAGNOSIS — G4733 Obstructive sleep apnea (adult) (pediatric): Secondary | ICD-10-CM | POA: Diagnosis not present

## 2017-05-30 DIAGNOSIS — R0902 Hypoxemia: Secondary | ICD-10-CM | POA: Diagnosis not present

## 2017-06-06 DIAGNOSIS — R251 Tremor, unspecified: Secondary | ICD-10-CM | POA: Diagnosis not present

## 2017-06-27 DIAGNOSIS — M47816 Spondylosis without myelopathy or radiculopathy, lumbar region: Secondary | ICD-10-CM | POA: Diagnosis not present

## 2017-07-04 ENCOUNTER — Other Ambulatory Visit: Payer: Self-pay | Admitting: Nurse Practitioner

## 2017-07-04 DIAGNOSIS — M47816 Spondylosis without myelopathy or radiculopathy, lumbar region: Secondary | ICD-10-CM

## 2017-07-06 ENCOUNTER — Ambulatory Visit
Admission: RE | Admit: 2017-07-06 | Discharge: 2017-07-06 | Disposition: A | Discharge status: Home / Self Care | Payer: Medicare Other | Source: Ambulatory Visit | Attending: Nurse Practitioner | Admitting: Nurse Practitioner

## 2017-07-06 DIAGNOSIS — M47816 Spondylosis without myelopathy or radiculopathy, lumbar region: Secondary | ICD-10-CM

## 2017-07-06 DIAGNOSIS — M48061 Spinal stenosis, lumbar region without neurogenic claudication: Secondary | ICD-10-CM | POA: Diagnosis not present

## 2017-07-08 DIAGNOSIS — H52202 Unspecified astigmatism, left eye: Secondary | ICD-10-CM | POA: Diagnosis not present

## 2017-07-08 DIAGNOSIS — H25012 Cortical age-related cataract, left eye: Secondary | ICD-10-CM | POA: Diagnosis not present

## 2017-07-08 DIAGNOSIS — H2512 Age-related nuclear cataract, left eye: Secondary | ICD-10-CM | POA: Diagnosis not present

## 2017-07-09 DIAGNOSIS — H2511 Age-related nuclear cataract, right eye: Secondary | ICD-10-CM | POA: Diagnosis not present

## 2017-07-16 DIAGNOSIS — M47816 Spondylosis without myelopathy or radiculopathy, lumbar region: Secondary | ICD-10-CM | POA: Diagnosis not present

## 2017-07-18 ENCOUNTER — Other Ambulatory Visit: Payer: Self-pay | Admitting: Neurosurgery

## 2017-07-18 DIAGNOSIS — M47816 Spondylosis without myelopathy or radiculopathy, lumbar region: Secondary | ICD-10-CM

## 2017-07-22 DIAGNOSIS — H52201 Unspecified astigmatism, right eye: Secondary | ICD-10-CM | POA: Diagnosis not present

## 2017-07-22 DIAGNOSIS — H2511 Age-related nuclear cataract, right eye: Secondary | ICD-10-CM | POA: Diagnosis not present

## 2017-07-22 DIAGNOSIS — H25011 Cortical age-related cataract, right eye: Secondary | ICD-10-CM | POA: Diagnosis not present

## 2017-07-25 ENCOUNTER — Other Ambulatory Visit: Payer: Medicare Other

## 2017-08-12 DIAGNOSIS — H35033 Hypertensive retinopathy, bilateral: Secondary | ICD-10-CM | POA: Diagnosis not present

## 2017-08-12 DIAGNOSIS — H34832 Tributary (branch) retinal vein occlusion, left eye, with macular edema: Secondary | ICD-10-CM | POA: Diagnosis not present

## 2017-08-12 DIAGNOSIS — H3562 Retinal hemorrhage, left eye: Secondary | ICD-10-CM | POA: Diagnosis not present

## 2017-08-12 DIAGNOSIS — H43813 Vitreous degeneration, bilateral: Secondary | ICD-10-CM | POA: Diagnosis not present

## 2017-08-13 ENCOUNTER — Other Ambulatory Visit: Payer: Self-pay | Admitting: Nurse Practitioner

## 2017-08-13 DIAGNOSIS — M47817 Spondylosis without myelopathy or radiculopathy, lumbosacral region: Secondary | ICD-10-CM

## 2017-08-23 ENCOUNTER — Ambulatory Visit
Admission: RE | Admit: 2017-08-23 | Discharge: 2017-08-23 | Disposition: A | Discharge status: Home / Self Care | Payer: Medicare Other | Source: Ambulatory Visit | Attending: Nurse Practitioner | Admitting: Nurse Practitioner

## 2017-08-23 ENCOUNTER — Other Ambulatory Visit: Payer: Self-pay | Admitting: Nurse Practitioner

## 2017-08-23 DIAGNOSIS — M47817 Spondylosis without myelopathy or radiculopathy, lumbosacral region: Secondary | ICD-10-CM

## 2017-08-23 DIAGNOSIS — M545 Low back pain: Secondary | ICD-10-CM | POA: Diagnosis not present

## 2017-08-23 NOTE — Discharge Instructions (Signed)

## 2017-08-27 NOTE — Discharge Instructions (Signed)
Radio Frequency Ablation Post Procedure Discharge Instructions ° °1. May resume a regular diet and any medications that you routinely take (including pain medications). °2. No driving day of procedure. °3. Upon discharge go home and rest for at least 4 hours.  May use an ice pack as needed to injection sites on back. °4. Remove bandades later, today. ° ° ° °Please contact our office at [phone] for the following symptoms: ° °· Fever greater than 100 degrees °· Increased swelling, pain, or redness at injection site. ° ° °Thank you for visiting [facility]. °

## 2017-08-28 ENCOUNTER — Other Ambulatory Visit: Payer: Self-pay | Admitting: Nurse Practitioner

## 2017-08-28 ENCOUNTER — Ambulatory Visit
Admission: RE | Admit: 2017-08-28 | Discharge: 2017-08-28 | Disposition: A | Discharge status: Home / Self Care | Payer: Medicare Other | Source: Ambulatory Visit | Attending: Nurse Practitioner | Admitting: Nurse Practitioner

## 2017-08-28 ENCOUNTER — Other Ambulatory Visit: Payer: Medicare Other

## 2017-08-28 DIAGNOSIS — M47817 Spondylosis without myelopathy or radiculopathy, lumbosacral region: Secondary | ICD-10-CM

## 2017-08-28 MED ORDER — FENTANYL CITRATE (PF) 100 MCG/2ML IJ SOLN
25.0000 ug | INTRAMUSCULAR | Status: DC | PRN
  Administered 2017-08-28: 50 ug via INTRAVENOUS

## 2017-08-28 MED ORDER — SODIUM CHLORIDE 0.9 % IV SOLN
Freq: Once | INTRAVENOUS | Status: AC
  Administered 2017-08-28: 08:00:00 via INTRAVENOUS

## 2017-08-28 MED ORDER — MIDAZOLAM HCL 2 MG/2ML IJ SOLN
1.0000 mg | INTRAMUSCULAR | Status: DC | PRN
  Administered 2017-08-28: 2 mg via INTRAVENOUS

## 2017-08-28 MED ORDER — KETOROLAC TROMETHAMINE 30 MG/ML IJ SOLN: 30.0000 mg | Freq: Once | INTRAMUSCULAR | Status: DC

## 2017-08-28 NOTE — Progress Notes (Signed)
Pt tolerated procedure well. Pt currently drowsy but answering questions appropriately, opening eyes to speech. Pt denies pain at this time.

## 2017-09-17 DIAGNOSIS — M47817 Spondylosis without myelopathy or radiculopathy, lumbosacral region: Secondary | ICD-10-CM | POA: Diagnosis not present

## 2017-09-17 DIAGNOSIS — M47816 Spondylosis without myelopathy or radiculopathy, lumbar region: Secondary | ICD-10-CM | POA: Diagnosis not present

## 2017-10-24 DIAGNOSIS — M47816 Spondylosis without myelopathy or radiculopathy, lumbar region: Secondary | ICD-10-CM | POA: Diagnosis not present

## 2017-10-24 DIAGNOSIS — Z01818 Encounter for other preprocedural examination: Secondary | ICD-10-CM | POA: Diagnosis not present

## 2017-10-28 DIAGNOSIS — M4326 Fusion of spine, lumbar region: Secondary | ICD-10-CM | POA: Diagnosis not present

## 2017-10-28 DIAGNOSIS — I10 Essential (primary) hypertension: Secondary | ICD-10-CM | POA: Diagnosis not present

## 2017-10-28 DIAGNOSIS — M479 Spondylosis, unspecified: Secondary | ICD-10-CM | POA: Diagnosis not present

## 2017-10-28 DIAGNOSIS — G8929 Other chronic pain: Secondary | ICD-10-CM | POA: Diagnosis present

## 2017-10-28 DIAGNOSIS — M545 Low back pain: Secondary | ICD-10-CM | POA: Diagnosis present

## 2017-10-28 DIAGNOSIS — R410 Disorientation, unspecified: Secondary | ICD-10-CM | POA: Diagnosis not present

## 2017-10-28 DIAGNOSIS — M4720 Other spondylosis with radiculopathy, site unspecified: Secondary | ICD-10-CM | POA: Diagnosis not present

## 2017-10-28 DIAGNOSIS — M4726 Other spondylosis with radiculopathy, lumbar region: Secondary | ICD-10-CM | POA: Diagnosis not present

## 2017-10-28 DIAGNOSIS — M4727 Other spondylosis with radiculopathy, lumbosacral region: Secondary | ICD-10-CM | POA: Diagnosis not present

## 2017-10-28 DIAGNOSIS — M4306 Spondylolysis, lumbar region: Secondary | ICD-10-CM | POA: Diagnosis not present

## 2017-11-01 DIAGNOSIS — M4726 Other spondylosis with radiculopathy, lumbar region: Secondary | ICD-10-CM | POA: Diagnosis not present

## 2017-11-01 DIAGNOSIS — Z981 Arthrodesis status: Secondary | ICD-10-CM | POA: Diagnosis not present

## 2017-11-01 DIAGNOSIS — Z4789 Encounter for other orthopedic aftercare: Secondary | ICD-10-CM | POA: Diagnosis not present

## 2017-11-01 DIAGNOSIS — N390 Urinary tract infection, site not specified: Secondary | ICD-10-CM | POA: Diagnosis not present

## 2017-11-01 DIAGNOSIS — Z7982 Long term (current) use of aspirin: Secondary | ICD-10-CM | POA: Diagnosis not present

## 2017-11-01 DIAGNOSIS — I10 Essential (primary) hypertension: Secondary | ICD-10-CM | POA: Diagnosis not present

## 2017-11-04 DIAGNOSIS — Z981 Arthrodesis status: Secondary | ICD-10-CM | POA: Diagnosis not present

## 2017-11-04 DIAGNOSIS — M47816 Spondylosis without myelopathy or radiculopathy, lumbar region: Secondary | ICD-10-CM | POA: Diagnosis not present

## 2017-11-04 DIAGNOSIS — M4326 Fusion of spine, lumbar region: Secondary | ICD-10-CM | POA: Diagnosis not present

## 2017-11-05 DIAGNOSIS — Z4789 Encounter for other orthopedic aftercare: Secondary | ICD-10-CM | POA: Diagnosis not present

## 2017-11-05 DIAGNOSIS — M4726 Other spondylosis with radiculopathy, lumbar region: Secondary | ICD-10-CM | POA: Diagnosis not present

## 2017-11-05 DIAGNOSIS — Z7982 Long term (current) use of aspirin: Secondary | ICD-10-CM | POA: Diagnosis not present

## 2017-11-05 DIAGNOSIS — N390 Urinary tract infection, site not specified: Secondary | ICD-10-CM | POA: Diagnosis not present

## 2017-11-05 DIAGNOSIS — I10 Essential (primary) hypertension: Secondary | ICD-10-CM | POA: Diagnosis not present

## 2017-11-05 DIAGNOSIS — Z981 Arthrodesis status: Secondary | ICD-10-CM | POA: Diagnosis not present

## 2017-11-06 DIAGNOSIS — Z4789 Encounter for other orthopedic aftercare: Secondary | ICD-10-CM | POA: Diagnosis not present

## 2017-11-06 DIAGNOSIS — M4726 Other spondylosis with radiculopathy, lumbar region: Secondary | ICD-10-CM | POA: Diagnosis not present

## 2017-11-06 DIAGNOSIS — Z7982 Long term (current) use of aspirin: Secondary | ICD-10-CM | POA: Diagnosis not present

## 2017-11-06 DIAGNOSIS — I10 Essential (primary) hypertension: Secondary | ICD-10-CM | POA: Diagnosis not present

## 2017-11-06 DIAGNOSIS — Z981 Arthrodesis status: Secondary | ICD-10-CM | POA: Diagnosis not present

## 2017-11-06 DIAGNOSIS — N390 Urinary tract infection, site not specified: Secondary | ICD-10-CM | POA: Diagnosis not present

## 2017-11-08 DIAGNOSIS — I10 Essential (primary) hypertension: Secondary | ICD-10-CM | POA: Diagnosis not present

## 2017-11-08 DIAGNOSIS — N390 Urinary tract infection, site not specified: Secondary | ICD-10-CM | POA: Diagnosis not present

## 2017-11-08 DIAGNOSIS — M4726 Other spondylosis with radiculopathy, lumbar region: Secondary | ICD-10-CM | POA: Diagnosis not present

## 2017-11-08 DIAGNOSIS — Z7982 Long term (current) use of aspirin: Secondary | ICD-10-CM | POA: Diagnosis not present

## 2017-11-08 DIAGNOSIS — Z4789 Encounter for other orthopedic aftercare: Secondary | ICD-10-CM | POA: Diagnosis not present

## 2017-11-08 DIAGNOSIS — Z981 Arthrodesis status: Secondary | ICD-10-CM | POA: Diagnosis not present

## 2017-11-11 DIAGNOSIS — M4726 Other spondylosis with radiculopathy, lumbar region: Secondary | ICD-10-CM | POA: Diagnosis not present

## 2017-11-11 DIAGNOSIS — Z4789 Encounter for other orthopedic aftercare: Secondary | ICD-10-CM | POA: Diagnosis not present

## 2017-11-11 DIAGNOSIS — Z981 Arthrodesis status: Secondary | ICD-10-CM | POA: Diagnosis not present

## 2017-11-11 DIAGNOSIS — I10 Essential (primary) hypertension: Secondary | ICD-10-CM | POA: Diagnosis not present

## 2017-11-11 DIAGNOSIS — Z7982 Long term (current) use of aspirin: Secondary | ICD-10-CM | POA: Diagnosis not present

## 2017-11-11 DIAGNOSIS — N390 Urinary tract infection, site not specified: Secondary | ICD-10-CM | POA: Diagnosis not present

## 2017-11-13 DIAGNOSIS — Z4789 Encounter for other orthopedic aftercare: Secondary | ICD-10-CM | POA: Diagnosis not present

## 2017-11-13 DIAGNOSIS — M4726 Other spondylosis with radiculopathy, lumbar region: Secondary | ICD-10-CM | POA: Diagnosis not present

## 2017-11-13 DIAGNOSIS — Z7982 Long term (current) use of aspirin: Secondary | ICD-10-CM | POA: Diagnosis not present

## 2017-11-13 DIAGNOSIS — N390 Urinary tract infection, site not specified: Secondary | ICD-10-CM | POA: Diagnosis not present

## 2017-11-13 DIAGNOSIS — Z981 Arthrodesis status: Secondary | ICD-10-CM | POA: Diagnosis not present

## 2017-11-13 DIAGNOSIS — I10 Essential (primary) hypertension: Secondary | ICD-10-CM | POA: Diagnosis not present

## 2017-11-15 DIAGNOSIS — N390 Urinary tract infection, site not specified: Secondary | ICD-10-CM | POA: Diagnosis not present

## 2017-11-15 DIAGNOSIS — I10 Essential (primary) hypertension: Secondary | ICD-10-CM | POA: Diagnosis not present

## 2017-11-15 DIAGNOSIS — Z981 Arthrodesis status: Secondary | ICD-10-CM | POA: Diagnosis not present

## 2017-11-15 DIAGNOSIS — Z7982 Long term (current) use of aspirin: Secondary | ICD-10-CM | POA: Diagnosis not present

## 2017-11-15 DIAGNOSIS — M4726 Other spondylosis with radiculopathy, lumbar region: Secondary | ICD-10-CM | POA: Diagnosis not present

## 2017-11-15 DIAGNOSIS — Z4789 Encounter for other orthopedic aftercare: Secondary | ICD-10-CM | POA: Diagnosis not present

## 2017-11-18 DIAGNOSIS — Z981 Arthrodesis status: Secondary | ICD-10-CM | POA: Diagnosis not present

## 2017-11-18 DIAGNOSIS — I10 Essential (primary) hypertension: Secondary | ICD-10-CM | POA: Diagnosis not present

## 2017-11-18 DIAGNOSIS — Z7982 Long term (current) use of aspirin: Secondary | ICD-10-CM | POA: Diagnosis not present

## 2017-11-18 DIAGNOSIS — Z4789 Encounter for other orthopedic aftercare: Secondary | ICD-10-CM | POA: Diagnosis not present

## 2017-11-18 DIAGNOSIS — M4726 Other spondylosis with radiculopathy, lumbar region: Secondary | ICD-10-CM | POA: Diagnosis not present

## 2017-11-18 DIAGNOSIS — N390 Urinary tract infection, site not specified: Secondary | ICD-10-CM | POA: Diagnosis not present

## 2017-11-19 DIAGNOSIS — N390 Urinary tract infection, site not specified: Secondary | ICD-10-CM | POA: Diagnosis not present

## 2017-11-19 DIAGNOSIS — M4726 Other spondylosis with radiculopathy, lumbar region: Secondary | ICD-10-CM | POA: Diagnosis not present

## 2017-11-19 DIAGNOSIS — Z7982 Long term (current) use of aspirin: Secondary | ICD-10-CM | POA: Diagnosis not present

## 2017-11-19 DIAGNOSIS — Z4789 Encounter for other orthopedic aftercare: Secondary | ICD-10-CM | POA: Diagnosis not present

## 2017-11-19 DIAGNOSIS — I10 Essential (primary) hypertension: Secondary | ICD-10-CM | POA: Diagnosis not present

## 2017-11-19 DIAGNOSIS — Z981 Arthrodesis status: Secondary | ICD-10-CM | POA: Diagnosis not present

## 2017-12-10 ENCOUNTER — Other Ambulatory Visit: Payer: Self-pay | Admitting: Nurse Practitioner

## 2017-12-10 DIAGNOSIS — M47816 Spondylosis without myelopathy or radiculopathy, lumbar region: Secondary | ICD-10-CM

## 2017-12-19 ENCOUNTER — Ambulatory Visit
Admission: RE | Admit: 2017-12-19 | Discharge: 2017-12-19 | Disposition: A | Discharge status: Home / Self Care | Payer: Medicare Other | Source: Ambulatory Visit | Attending: Nurse Practitioner | Admitting: Nurse Practitioner

## 2017-12-19 DIAGNOSIS — M48061 Spinal stenosis, lumbar region without neurogenic claudication: Secondary | ICD-10-CM | POA: Diagnosis not present

## 2017-12-19 DIAGNOSIS — M47816 Spondylosis without myelopathy or radiculopathy, lumbar region: Secondary | ICD-10-CM

## 2017-12-23 DIAGNOSIS — H532 Diplopia: Secondary | ICD-10-CM | POA: Diagnosis not present

## 2017-12-23 DIAGNOSIS — H3581 Retinal edema: Secondary | ICD-10-CM | POA: Diagnosis not present

## 2018-02-06 DIAGNOSIS — G4733 Obstructive sleep apnea (adult) (pediatric): Secondary | ICD-10-CM | POA: Diagnosis not present

## 2018-03-04 DIAGNOSIS — M47816 Spondylosis without myelopathy or radiculopathy, lumbar region: Secondary | ICD-10-CM | POA: Diagnosis not present

## 2018-04-11 DIAGNOSIS — E7800 Pure hypercholesterolemia, unspecified: Secondary | ICD-10-CM | POA: Diagnosis not present

## 2018-04-11 DIAGNOSIS — Z0000 Encounter for general adult medical examination without abnormal findings: Secondary | ICD-10-CM | POA: Diagnosis not present

## 2018-04-11 DIAGNOSIS — Z1389 Encounter for screening for other disorder: Secondary | ICD-10-CM | POA: Diagnosis not present

## 2018-04-11 DIAGNOSIS — I10 Essential (primary) hypertension: Secondary | ICD-10-CM | POA: Diagnosis not present

## 2018-05-21 HISTORY — PX: LUMBAR FUSION: SHX111

## 2018-05-30 DIAGNOSIS — J069 Acute upper respiratory infection, unspecified: Secondary | ICD-10-CM | POA: Diagnosis not present

## 2018-07-01 DIAGNOSIS — H34812 Central retinal vein occlusion, left eye, with macular edema: Secondary | ICD-10-CM | POA: Diagnosis not present

## 2018-07-01 DIAGNOSIS — H3562 Retinal hemorrhage, left eye: Secondary | ICD-10-CM | POA: Diagnosis not present

## 2018-07-01 DIAGNOSIS — H532 Diplopia: Secondary | ICD-10-CM | POA: Diagnosis not present

## 2018-07-09 DIAGNOSIS — R2991 Unspecified symptoms and signs involving the musculoskeletal system: Secondary | ICD-10-CM | POA: Insufficient documentation

## 2018-07-09 DIAGNOSIS — M47816 Spondylosis without myelopathy or radiculopathy, lumbar region: Secondary | ICD-10-CM | POA: Diagnosis not present

## 2018-07-09 DIAGNOSIS — IMO0001 Reserved for inherently not codable concepts without codable children: Secondary | ICD-10-CM | POA: Insufficient documentation

## 2018-07-09 DIAGNOSIS — I7090 Unspecified atherosclerosis: Secondary | ICD-10-CM | POA: Insufficient documentation

## 2018-07-09 DIAGNOSIS — I4460 Unspecified fascicular block: Secondary | ICD-10-CM | POA: Insufficient documentation

## 2018-07-09 DIAGNOSIS — N400 Enlarged prostate without lower urinary tract symptoms: Secondary | ICD-10-CM | POA: Insufficient documentation

## 2018-07-09 DIAGNOSIS — M47817 Spondylosis without myelopathy or radiculopathy, lumbosacral region: Secondary | ICD-10-CM | POA: Diagnosis not present

## 2018-07-09 DIAGNOSIS — K4090 Unilateral inguinal hernia, without obstruction or gangrene, not specified as recurrent: Secondary | ICD-10-CM | POA: Insufficient documentation

## 2018-07-09 DIAGNOSIS — N201 Calculus of ureter: Secondary | ICD-10-CM | POA: Insufficient documentation

## 2018-07-09 DIAGNOSIS — K5730 Diverticulosis of large intestine without perforation or abscess without bleeding: Secondary | ICD-10-CM | POA: Insufficient documentation

## 2018-07-09 DIAGNOSIS — Z5320 Procedure and treatment not carried out because of patient's decision for unspecified reasons: Secondary | ICD-10-CM | POA: Insufficient documentation

## 2018-07-09 DIAGNOSIS — I491 Atrial premature depolarization: Secondary | ICD-10-CM | POA: Insufficient documentation

## 2018-07-09 DIAGNOSIS — I708 Atherosclerosis of other arteries: Secondary | ICD-10-CM | POA: Insufficient documentation

## 2018-11-07 DIAGNOSIS — M961 Postlaminectomy syndrome, not elsewhere classified: Secondary | ICD-10-CM | POA: Insufficient documentation

## 2018-11-07 DIAGNOSIS — M48061 Spinal stenosis, lumbar region without neurogenic claudication: Secondary | ICD-10-CM | POA: Diagnosis not present

## 2018-11-07 DIAGNOSIS — Z79899 Other long term (current) drug therapy: Secondary | ICD-10-CM | POA: Diagnosis not present

## 2018-12-03 DIAGNOSIS — L814 Other melanin hyperpigmentation: Secondary | ICD-10-CM | POA: Diagnosis not present

## 2018-12-03 DIAGNOSIS — L570 Actinic keratosis: Secondary | ICD-10-CM | POA: Diagnosis not present

## 2018-12-03 DIAGNOSIS — L821 Other seborrheic keratosis: Secondary | ICD-10-CM | POA: Diagnosis not present

## 2018-12-03 DIAGNOSIS — D229 Melanocytic nevi, unspecified: Secondary | ICD-10-CM | POA: Diagnosis not present

## 2018-12-03 DIAGNOSIS — L819 Disorder of pigmentation, unspecified: Secondary | ICD-10-CM | POA: Diagnosis not present

## 2018-12-03 DIAGNOSIS — L308 Other specified dermatitis: Secondary | ICD-10-CM | POA: Diagnosis not present

## 2018-12-03 DIAGNOSIS — D1801 Hemangioma of skin and subcutaneous tissue: Secondary | ICD-10-CM | POA: Diagnosis not present

## 2018-12-08 DIAGNOSIS — M47816 Spondylosis without myelopathy or radiculopathy, lumbar region: Secondary | ICD-10-CM | POA: Diagnosis not present

## 2018-12-30 DIAGNOSIS — M47816 Spondylosis without myelopathy or radiculopathy, lumbar region: Secondary | ICD-10-CM | POA: Diagnosis not present

## 2019-02-05 DIAGNOSIS — G4733 Obstructive sleep apnea (adult) (pediatric): Secondary | ICD-10-CM | POA: Diagnosis not present

## 2019-02-09 DIAGNOSIS — M47816 Spondylosis without myelopathy or radiculopathy, lumbar region: Secondary | ICD-10-CM | POA: Diagnosis not present

## 2019-02-16 DIAGNOSIS — M47816 Spondylosis without myelopathy or radiculopathy, lumbar region: Secondary | ICD-10-CM | POA: Diagnosis not present

## 2019-02-18 DIAGNOSIS — R0902 Hypoxemia: Secondary | ICD-10-CM | POA: Diagnosis not present

## 2019-02-23 DIAGNOSIS — H43813 Vitreous degeneration, bilateral: Secondary | ICD-10-CM | POA: Diagnosis not present

## 2019-02-23 DIAGNOSIS — H35033 Hypertensive retinopathy, bilateral: Secondary | ICD-10-CM | POA: Diagnosis not present

## 2019-02-23 DIAGNOSIS — H35373 Puckering of macula, bilateral: Secondary | ICD-10-CM | POA: Diagnosis not present

## 2019-02-23 DIAGNOSIS — H348322 Tributary (branch) retinal vein occlusion, left eye, stable: Secondary | ICD-10-CM | POA: Diagnosis not present

## 2019-03-26 DIAGNOSIS — M47816 Spondylosis without myelopathy or radiculopathy, lumbar region: Secondary | ICD-10-CM | POA: Diagnosis not present

## 2019-04-24 DIAGNOSIS — I10 Essential (primary) hypertension: Secondary | ICD-10-CM | POA: Diagnosis not present

## 2019-04-24 DIAGNOSIS — Z1389 Encounter for screening for other disorder: Secondary | ICD-10-CM | POA: Diagnosis not present

## 2019-04-24 DIAGNOSIS — T466X5A Adverse effect of antihyperlipidemic and antiarteriosclerotic drugs, initial encounter: Secondary | ICD-10-CM | POA: Diagnosis not present

## 2019-04-24 DIAGNOSIS — M609 Myositis, unspecified: Secondary | ICD-10-CM | POA: Diagnosis not present

## 2019-04-24 DIAGNOSIS — F4321 Adjustment disorder with depressed mood: Secondary | ICD-10-CM | POA: Diagnosis not present

## 2019-04-24 DIAGNOSIS — E7800 Pure hypercholesterolemia, unspecified: Secondary | ICD-10-CM | POA: Diagnosis not present

## 2019-04-24 DIAGNOSIS — Z0000 Encounter for general adult medical examination without abnormal findings: Secondary | ICD-10-CM | POA: Diagnosis not present

## 2019-06-25 DIAGNOSIS — M25562 Pain in left knee: Secondary | ICD-10-CM | POA: Diagnosis not present

## 2019-07-24 DIAGNOSIS — Z961 Presence of intraocular lens: Secondary | ICD-10-CM | POA: Diagnosis not present

## 2019-07-24 DIAGNOSIS — H01015 Ulcerative blepharitis left lower eyelid: Secondary | ICD-10-CM | POA: Diagnosis not present

## 2019-07-24 DIAGNOSIS — H01012 Ulcerative blepharitis right lower eyelid: Secondary | ICD-10-CM | POA: Diagnosis not present

## 2019-07-24 DIAGNOSIS — H43813 Vitreous degeneration, bilateral: Secondary | ICD-10-CM | POA: Diagnosis not present

## 2019-08-13 DIAGNOSIS — M25512 Pain in left shoulder: Secondary | ICD-10-CM | POA: Diagnosis not present

## 2019-08-28 DIAGNOSIS — Z23 Encounter for immunization: Secondary | ICD-10-CM | POA: Diagnosis not present

## 2019-09-25 DIAGNOSIS — Z23 Encounter for immunization: Secondary | ICD-10-CM | POA: Diagnosis not present

## 2019-12-03 DIAGNOSIS — Z961 Presence of intraocular lens: Secondary | ICD-10-CM | POA: Diagnosis not present

## 2019-12-03 DIAGNOSIS — H53143 Visual discomfort, bilateral: Secondary | ICD-10-CM | POA: Diagnosis not present

## 2020-01-12 DIAGNOSIS — M25562 Pain in left knee: Secondary | ICD-10-CM | POA: Diagnosis not present

## 2020-01-22 DIAGNOSIS — R634 Abnormal weight loss: Secondary | ICD-10-CM | POA: Diagnosis not present

## 2020-01-22 DIAGNOSIS — F4321 Adjustment disorder with depressed mood: Secondary | ICD-10-CM | POA: Diagnosis not present

## 2020-02-02 DIAGNOSIS — G4733 Obstructive sleep apnea (adult) (pediatric): Secondary | ICD-10-CM | POA: Diagnosis not present

## 2020-03-07 DIAGNOSIS — M47816 Spondylosis without myelopathy or radiculopathy, lumbar region: Secondary | ICD-10-CM | POA: Diagnosis not present

## 2020-03-17 DIAGNOSIS — M47816 Spondylosis without myelopathy or radiculopathy, lumbar region: Secondary | ICD-10-CM | POA: Diagnosis not present

## 2020-03-28 DIAGNOSIS — Z23 Encounter for immunization: Secondary | ICD-10-CM | POA: Diagnosis not present

## 2020-03-30 DIAGNOSIS — L918 Other hypertrophic disorders of the skin: Secondary | ICD-10-CM | POA: Diagnosis not present

## 2020-03-30 DIAGNOSIS — L579 Skin changes due to chronic exposure to nonionizing radiation, unspecified: Secondary | ICD-10-CM | POA: Diagnosis not present

## 2020-03-30 DIAGNOSIS — Z85828 Personal history of other malignant neoplasm of skin: Secondary | ICD-10-CM | POA: Diagnosis not present

## 2020-04-19 DIAGNOSIS — M47816 Spondylosis without myelopathy or radiculopathy, lumbar region: Secondary | ICD-10-CM | POA: Diagnosis not present

## 2020-05-05 DIAGNOSIS — R251 Tremor, unspecified: Secondary | ICD-10-CM | POA: Diagnosis not present

## 2020-05-05 DIAGNOSIS — R252 Cramp and spasm: Secondary | ICD-10-CM | POA: Diagnosis not present

## 2020-05-17 ENCOUNTER — Encounter: Payer: Self-pay | Admitting: Neurology

## 2020-05-26 NOTE — Progress Notes (Deleted)
Assessment/Plan:   ***  Subjective:   Aaron Chang was seen today in the movement disorders clinic for neurologic consultation at the request of Renford Dills, MD.  The consultation is for the evaluation of "jerking and tremor" in hands.  Tremor: {yes no:314532}   How long has it been going on? ***  At rest or with activation?  ***  When is it noted the most?  ***  Fam hx of tremor?  {yes IZ:124580}  Located where?  ***  Affected by caffeine:  {yes no:314532}  Affected by alcohol:  {yes no:314532}  Affected by stress:  {yes no:314532}  Affected by fatigue:  {yes no:314532}  Spills soup if on spoon:  {yes no:314532}  Spills glass of liquid if full:  {yes no:314532}  Affects ADL's (tying shoes, brushing teeth, etc):  {yes no:314532}  Tremor inducing meds:  {yes no:314532} on gabapentin (? Myoclonus)  Other Specific Symptoms:  Voice: *** Sleep: ***  Vivid Dreams:  {yes no:314532}  Acting out dreams:  {yes no:314532} Wet Pillows: {yes no:314532} Postural symptoms:  {yes no:314532}  Falls?  {yes no:314532} Bradykinesia symptoms: {parkinson brady:18041} Loss of smell:  {yes no:314532} Loss of taste:  {yes no:314532} Urinary Incontinence:  {yes no:314532} Difficulty Swallowing:  {yes no:314532} Handwriting, micrographia: {yes no:314532} Trouble with ADL's:  {yes no:314532}  Trouble buttoning clothing: {yes no:314532} Depression:  {yes no:314532} Memory changes:  {yes no:314532} Hallucinations:  {yes no:314532}  visual distortions: {yes no:314532} N/V:  {yes no:314532} Lightheaded:  {yes no:314532}  Syncope: {yes no:314532} Diplopia:  {yes no:314532} Dyskinesia:  {yes no:314532}  Neuroimaging of the brain has not previously been performed.  It *** available for my review today.  PREVIOUS MEDICATIONS: {Parkinson's RX:18200}  ALLERGIES:  Not on File  CURRENT MEDICATIONS: No current outpatient medications  Objective:   PHYSICAL EXAMINATION:    VITALS:  There  were no vitals filed for this visit.  GEN:  The patient appears stated age and is in NAD. HEENT:  Normocephalic, atraumatic.  The mucous membranes are moist. The superficial temporal arteries are without ropiness or tenderness. CV:  RRR Lungs:  CTAB Neck/HEME:  There are no carotid bruits bilaterally.  Neurological examination:  Orientation: The patient is alert and oriented x3.  Cranial nerves: There is good facial symmetry.  Extraocular muscles are intact. The visual fields are full to confrontational testing. The speech is fluent and clear. Soft palate rises symmetrically and there is no tongue deviation. Hearing is intact to conversational tone. Sensation: Sensation is intact to light touch throughout (facial, trunk, extremities). Vibration is intact at the bilateral big toe. There is no extinction with double simultaneous stimulation.  Motor: Strength is 5/5 in the bilateral upper and lower extremities.   Shoulder shrug is equal and symmetric.  There is no pronator drift. Deep tendon reflexes: Deep tendon reflexes are 2/4 at the bilateral biceps, triceps, brachioradialis, patella and achilles. Plantar responses are downgoing bilaterally.  Movement examination: Tone: There is ***tone in the bilateral upper extremities.  The tone in the lower extremities is ***.  Abnormal movements: *** Coordination:  There is *** decremation with RAM's, *** Gait and Station: The patient has *** difficulty arising out of a deep-seated chair without the use of the hands. The patient's stride length is ***.  The patient has a *** pull test.     I have reviewed and interpreted the following labs independently Patient had lab work done on May 05, 2020.  Sodium was 140, potassium 4.1, chloride  103, CO2 29, BUN 15, creatinine 0.90, TSH 1.74, AST 17, ALT 13, B12 526   Total time spent on today's visit was ***greater than 60 minutes, including both face-to-face time and nonface-to-face time.  Time included  that spent on review of records (prior notes available to me/labs/imaging if pertinent), discussing treatment and goals, answering patient's questions and coordinating care.  Cc:  Seward Carol, MD

## 2020-05-30 ENCOUNTER — Ambulatory Visit: Payer: Medicare Other | Admitting: Neurology

## 2020-05-30 NOTE — Progress Notes (Signed)
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  This is quite common.  Symptoms increased a few weeks ago, and he states he was placed on gabapentin about a month ago.  Discussed with patient that option would be to decrease the dose of gabapentin or get rid of it altogether.  They do not think it is helpful.  They are going to discuss with pain management physician in a few weeks.  Did discuss with them that Lyrica likely will produce the same side effect. 2. possible recent TIA  -Symptoms were very short-lived  -Start enteric-coated aspirin, 81 mg daily. R/B/SE were discussed.  The opportunity to ask questions was given and they were answered to the best of my ability.  The patient expressed understanding and willingness to follow the outlined treatment protocols.  -We will do MRI of the brain.  Discussed with patient and wife that we will likely see atrophy and small vessel disease.  Want to make sure we are not missing something else.  -We will do carotid ultrasound. 3.  Staring episodes  -Suspect that these are part of his bigger issue of memory loss, but we will check an EEG. 4.  Memory change, likely dementia  -Neurocognitive testing will be performed.  Suspect that the patient likely should not be driving.  We discussed this today.  Subjective:   NGAI PARCELL was seen today in the movement disorders clinic for neurologic consultation at the request of Seward Carol, MD.  The consultation is for the evaluation of "jerking and tremor" in hands.  Wife supplements hx.  They report today that it is more of a jerk than a tremor.  It happens daily, sometimes multiple times per day.  It can happen in either day.    It is generally one jerk and done but wife states that sometimes it does it a few times in a row.  It can happen with the hands at rest or with activation.  He started remeron a few months ago but jerking was before that.  Gabapentin started  about a month ago and worse since then  Wife also c/o MS changes.  It is intermittent.  He will have episodes of "staring."  He will respond if talked to but he is slow.  He generally be sitting and not walking.  It will occur off and on all day but wife cannot state how long each episode lasts.  No loss of bladder/bowel control.  This has been going on x 1 year.  There is baseline memory change x 1 year.    Living situation:  Pt lives with their spouse.  The patient does do the finances in the home but both do the finances together.  The patient does drive.   There have no been any motor vehicle accidents in the recent years.  He denies getting lost.  Wife states that they are always together but either of them may do the driving.  The patient does a little cooking - he is able to remember to turn off the stovetop.  There is  difficulty remembering common recipes.    ADL's:  The patient is able to perform his own ADL's. The patient self medicates.The patients bladder and bowel are under good control.  He did have an episode last week where he was incontinent of bladder several times one day (unusual for him) but it didn't recur.  He actually had an appt here and had to  cx.  Behavior:    There have been no behavioral changes over the years.  Last week, pt had an event where he was talking "gibberish."  It was words that were not words or words that did not have anything to do with the conversation at hand.  It was a few min and he was back to normal.  He denies paresthesias.  He has generalized weakness but nothing lateralizing that day.  Didn't get evaluated.  This was at night.       Neuroimaging of the brain has not previously been performed.      ALLERGIES:   Allergies  Allergen Reactions  . Chocolate Flavor     Other reaction(s): severe migraine  . Flomax [Tamsulosin]     Nightmare  . Statins     Muscle pains RAISE BLOOD PRESSURE  . Atenolol     fatigue  . Atorvastatin     Other  reaction(s): myalgia  . Chocolate     Other reaction(s): headache  . Other Nausea Only    Other reaction(s): Unknown  . Diazepam Other (See Comments)    hyper    CURRENT MEDICATIONS:  Current Outpatient Medications  Medication Instructions  . ascorbic acid (VITAMIN C) 100 MG tablet 1 tablet, Oral, Daily  . aspirin EC 81 mg, Oral, Daily, Swallow whole.  . Cholecalciferol (VITAMIN D) 125 MCG (5000 UT) CAPS 1 tablet, Oral, Daily  . Flaxseed, Linseed, (FLAX SEED OIL) 1000 MG CAPS 1 capsule, Oral, See admin instructions  . gabapentin (NEURONTIN) 300 MG capsule 1 capsule, Oral, 3 times daily  . hydrochlorothiazide (HYDRODIURIL) 12.5 MG tablet 1 tablet, Oral, Daily  . mirtazapine (REMERON) 7.5 mg, Oral, Daily at bedtime  . Multiple Vitamin (MULTI-VITAMIN) tablet 1 tablet, Oral, Daily  . nystatin ointment (MYCOSTATIN) Topical, As needed  . Omega-3 Fatty Acids (FISH OIL) 1200 MG CPDR 2 capsules, Oral, Daily  . Zinc 100 MG TABS 1 tablet, Oral, Daily    Objective:   PHYSICAL EXAMINATION:    VITALS:   Vitals:   06/02/20 0949  BP: (!) 150/80  Pulse: 61  SpO2: 97%  Weight: 167 lb (75.8 kg)  Height: 5\' 7"  (1.702 m)    GEN:  The patient appears stated age and is in NAD.  He is pleasant and jovial. HEENT:  Normocephalic, atraumatic.  The mucous membranes are moist. The superficial temporal arteries are without ropiness or tenderness. CV:  RRR Lungs:  CTAB Neck/HEME:  There are no carotid bruits bilaterally.  Neurological examination:  Orientation: The patient is alert and oriented to person and place.  Looks to his wife for help with finer aspects of the history. Cranial nerves: There is good facial symmetry.  Extraocular muscles are intact. The visual fields are full to confrontational testing. The speech is fluent and clear. Soft palate rises symmetrically and there is no tongue deviation. Hearing is intact to conversational tone. Sensation: Sensation is intact to light touch  throughout (facial, trunk, extremities). There is no extinction with double simultaneous stimulation.  Motor: Strength is 5/5 in the bilateral upper and lower extremities.   Shoulder shrug is equal and symmetric.  There is no pronator drift. Deep tendon reflexes: Deep tendon reflexes are 2/4 at the bilateral biceps, triceps, brachioradialis, patella . Plantar responses are downgoing bilaterally.  Movement examination: Tone: There is normal tone in the bilateral upper extremities.  The tone in the lower extremities is normal.  Abnormal movements: none.  No asterixis.  No rest or intention  tremor Coordination:  There is no decremation with RAM's.   Gait and Station: The patient has no difficulty arising out of a deep-seated chair without the use of the hands. The patient's stride length is good.  He is flexed at the waist.   I have reviewed and interpreted the following labs independently Patient had lab work done on May 05, 2020.  Sodium was 140, potassium 4.1, chloride 103, CO2 29, BUN 15, creatinine 0.90, TSH 1.74, AST 17, ALT 13, B12 526   Total time spent on today's visit was  60 minutes, including both face-to-face time and nonface-to-face time.  Time included that spent on review of records (prior notes available to me/labs/imaging if pertinent), discussing treatment and goals, answering patient's questions and coordinating care.  Cc:  Seward Carol, MD

## 2020-06-02 ENCOUNTER — Ambulatory Visit (INDEPENDENT_AMBULATORY_CARE_PROVIDER_SITE_OTHER): Payer: Medicare Other | Admitting: Neurology

## 2020-06-02 ENCOUNTER — Other Ambulatory Visit: Payer: Self-pay

## 2020-06-02 ENCOUNTER — Encounter: Payer: Self-pay | Admitting: Neurology

## 2020-06-02 VITALS — BP 150/80 | HR 61 | Ht 67.0 in | Wt 167.0 lb

## 2020-06-02 DIAGNOSIS — G253 Myoclonus: Secondary | ICD-10-CM

## 2020-06-02 DIAGNOSIS — R413 Other amnesia: Secondary | ICD-10-CM | POA: Diagnosis not present

## 2020-06-02 DIAGNOSIS — G459 Transient cerebral ischemic attack, unspecified: Secondary | ICD-10-CM

## 2020-06-02 DIAGNOSIS — R4702 Dysphasia: Secondary | ICD-10-CM

## 2020-06-02 DIAGNOSIS — G9340 Encephalopathy, unspecified: Secondary | ICD-10-CM | POA: Diagnosis not present

## 2020-06-02 MED ORDER — ASPIRIN EC 81 MG PO TBEC: 81.0000 mg | DELAYED_RELEASE_TABLET | Freq: Every day | ORAL | 11 refills | Status: DC

## 2020-06-02 NOTE — Patient Instructions (Addendum)
We have sent a referral to [facility] for your MRI and they will call you directly to schedule your appointment. They are located at [place]. If you need to contact them directly please call [phone].  Your physician has requested that you have a carotid duplex. This test is an ultrasound of the carotid arteries in your neck. It looks at blood flow through these arteries that supply the brain with blood. Allow one hour for this exam. There are no restrictions or special instructions.  Your physician has ordered a EEG.    Start taking a baby Aspirin 81mg  once a day.   You have been referred for a neurocognitive evaluation (i.e., evaluation of memory and thinking abilities). Please bring someone with you to this appointment if possible, as it is helpful for the neuropsychologist to hear from both you and another adult who knows you well. Please bring eyeglasses and hearing aids if you wear them.    The evaluation will take approximately 3 hours and has two parts:   . The first part is a clinical interview with the neuropsychologist, Dr. Melvyn Novas or Dr. Nicole Kindred. During the interview, the neuropsychologist will speak with you and the individual you brought to the appointment.    . The second part of the evaluation is testing with the doctor's technician, aka psychometrician, Hinton Dyer or [employer]. During the testing, the technician will ask you to remember different types of material, solve problems, and answer some questionnaires. Your family member will not be present for this portion of the evaluation.   Please note: We have to reserve several hours of the neuropsychologist's time and the psychometrician's time for your evaluation appointment. As such, there is a No-Show fee of $100. If you are unable to attend any of your appointments, please contact our office as soon as possible to reschedule.

## 2020-06-09 ENCOUNTER — Other Ambulatory Visit: Payer: Self-pay

## 2020-06-09 ENCOUNTER — Ambulatory Visit (HOSPITAL_COMMUNITY)
Admission: RE | Admit: 2020-06-09 | Discharge: 2020-06-09 | Disposition: A | Discharge status: Home / Self Care | Payer: Medicare Other | Source: Ambulatory Visit | Attending: Cardiology | Admitting: Cardiology

## 2020-06-09 DIAGNOSIS — G459 Transient cerebral ischemic attack, unspecified: Secondary | ICD-10-CM

## 2020-06-09 DIAGNOSIS — R4702 Dysphasia: Secondary | ICD-10-CM

## 2020-06-09 NOTE — Progress Notes (Signed)
MRI Brain W Contrast changed to MRI Brain W/WO Contrast per [facility].

## 2020-06-10 ENCOUNTER — Other Ambulatory Visit: Payer: Self-pay

## 2020-06-10 DIAGNOSIS — G459 Transient cerebral ischemic attack, unspecified: Secondary | ICD-10-CM

## 2020-06-10 DIAGNOSIS — G9340 Encephalopathy, unspecified: Secondary | ICD-10-CM

## 2020-06-10 DIAGNOSIS — R4702 Dysphasia: Secondary | ICD-10-CM

## 2020-06-13 ENCOUNTER — Other Ambulatory Visit: Payer: Self-pay

## 2020-06-13 ENCOUNTER — Ambulatory Visit (INDEPENDENT_AMBULATORY_CARE_PROVIDER_SITE_OTHER): Payer: Medicare Other | Admitting: Neurology

## 2020-06-13 DIAGNOSIS — G9340 Encephalopathy, unspecified: Secondary | ICD-10-CM | POA: Diagnosis not present

## 2020-06-13 DIAGNOSIS — R4702 Dysphasia: Secondary | ICD-10-CM

## 2020-06-13 DIAGNOSIS — G459 Transient cerebral ischemic attack, unspecified: Secondary | ICD-10-CM

## 2020-06-14 NOTE — Procedures (Signed)
TECHNICAL SUMMARY:  A multichannel referential and bipolar montage EEG using the standard international 10-20 system was performed on the patient described as awake, drowsy and asleep.  The dominant background activity consists of 8 hertz activity seen most prominantly over the posterior head region.  The backgound activity is reactive to eye opening and closing procedures.  Low voltage fast (beta) activity is distributed symmetrically and maximally over the anterior head regions.  ACTIVATION:  Stepwise photic stimulation at 4-20 flashes per second was performed and did not elicit any abnormal waveforms but did demonstrate a driving response.  Hyperventilation was not performed.  EPILEPTIFORM ACTIVITY:  There were no spikes, sharp waves or paroxysmal activity.  SLEEP:  Stage 1 and 2 sleep were identified  CARDIAC:  The EKG lead revealed an irregular rhythm on 1 lead EKG  IMPRESSION:  This is a normal EEG for the patients stated age.  There were no focal, hemispheric or lateralizing features.  No epileptiform activity was recorded.  A normal EEG does not exclude the diagnosis of a seizure disorder and if seizure remains high on the list of differential diagnosis, an ambulatory EEG may be of value.  Clinical correlation is required. Note:  EKG lead was abnormal but was only one lead.

## 2020-06-15 DIAGNOSIS — M961 Postlaminectomy syndrome, not elsewhere classified: Secondary | ICD-10-CM | POA: Diagnosis not present

## 2020-06-17 DIAGNOSIS — Z6825 Body mass index (BMI) 25.0-25.9, adult: Secondary | ICD-10-CM | POA: Diagnosis not present

## 2020-06-17 DIAGNOSIS — I10 Essential (primary) hypertension: Secondary | ICD-10-CM | POA: Diagnosis not present

## 2020-06-17 DIAGNOSIS — M48061 Spinal stenosis, lumbar region without neurogenic claudication: Secondary | ICD-10-CM | POA: Diagnosis not present

## 2020-06-17 DIAGNOSIS — M47816 Spondylosis without myelopathy or radiculopathy, lumbar region: Secondary | ICD-10-CM | POA: Diagnosis not present

## 2020-06-21 DIAGNOSIS — I10 Essential (primary) hypertension: Secondary | ICD-10-CM | POA: Diagnosis not present

## 2020-06-21 DIAGNOSIS — R9431 Abnormal electrocardiogram [ECG] [EKG]: Secondary | ICD-10-CM | POA: Diagnosis not present

## 2020-06-21 DIAGNOSIS — I472 Ventricular tachycardia: Secondary | ICD-10-CM | POA: Diagnosis not present

## 2020-07-02 ENCOUNTER — Ambulatory Visit
Admission: RE | Admit: 2020-07-02 | Discharge: 2020-07-02 | Disposition: A | Discharge status: Home / Self Care | Payer: Medicare Other | Source: Ambulatory Visit | Attending: Neurology | Admitting: Neurology

## 2020-07-02 ENCOUNTER — Other Ambulatory Visit: Payer: Self-pay

## 2020-07-02 DIAGNOSIS — G319 Degenerative disease of nervous system, unspecified: Secondary | ICD-10-CM | POA: Diagnosis not present

## 2020-07-02 DIAGNOSIS — I6389 Other cerebral infarction: Secondary | ICD-10-CM | POA: Diagnosis not present

## 2020-07-02 DIAGNOSIS — G459 Transient cerebral ischemic attack, unspecified: Secondary | ICD-10-CM

## 2020-07-02 DIAGNOSIS — I6782 Cerebral ischemia: Secondary | ICD-10-CM | POA: Diagnosis not present

## 2020-07-02 DIAGNOSIS — R4702 Dysphasia: Secondary | ICD-10-CM

## 2020-07-02 MED ORDER — GADOBENATE DIMEGLUMINE 529 MG/ML IV SOLN
15.0000 mL | Freq: Once | INTRAVENOUS | Status: AC | PRN
  Administered 2020-07-02: 15 mL via INTRAVENOUS

## 2020-07-04 DIAGNOSIS — M5416 Radiculopathy, lumbar region: Secondary | ICD-10-CM | POA: Diagnosis not present

## 2020-07-04 DIAGNOSIS — M5116 Intervertebral disc disorders with radiculopathy, lumbar region: Secondary | ICD-10-CM | POA: Diagnosis not present

## 2020-07-05 DIAGNOSIS — R9431 Abnormal electrocardiogram [ECG] [EKG]: Secondary | ICD-10-CM | POA: Diagnosis not present

## 2020-07-06 DIAGNOSIS — R9431 Abnormal electrocardiogram [ECG] [EKG]: Secondary | ICD-10-CM | POA: Diagnosis not present

## 2020-07-06 DIAGNOSIS — I472 Ventricular tachycardia: Secondary | ICD-10-CM | POA: Diagnosis not present

## 2020-07-07 ENCOUNTER — Telehealth: Payer: Self-pay | Admitting: Neurology

## 2020-07-07 NOTE — Telephone Encounter (Signed)
He did not have a TIA.  This is just an incidental finding on mri.  He is already on ASA

## 2020-07-07 NOTE — Telephone Encounter (Signed)
Spoke with wife is concerned because they reviewed the report from the MRI and saw where the report states the patient had a old stroke.   Wife wants to know if there is something they can do to prevent this in the future?   Is there any medication the patient needs to be taking?   Will a small stroke happen again?   Wife states she is very concerned because when she had a tia her provider tried to give her prescription.

## 2020-07-08 NOTE — Telephone Encounter (Signed)
Advised to contact [facility] or do my chart, unable to release report.

## 2020-07-08 NOTE — Telephone Encounter (Signed)
Line busy at 11:01am

## 2020-07-08 NOTE — Telephone Encounter (Signed)
They can either pull from mychart or they can get from the imaging center directly.  We cannot release images since they are not ours to release.

## 2020-07-08 NOTE — Telephone Encounter (Signed)
Wife would like copy of MRI sent to her, please advise.

## 2020-07-11 ENCOUNTER — Other Ambulatory Visit: Payer: Self-pay

## 2020-07-11 DIAGNOSIS — Z860100 Personal history of colon polyps, unspecified: Secondary | ICD-10-CM | POA: Insufficient documentation

## 2020-07-11 DIAGNOSIS — M545 Low back pain, unspecified: Secondary | ICD-10-CM | POA: Insufficient documentation

## 2020-07-11 DIAGNOSIS — F4321 Adjustment disorder with depressed mood: Secondary | ICD-10-CM | POA: Insufficient documentation

## 2020-07-11 DIAGNOSIS — E7800 Pure hypercholesterolemia, unspecified: Secondary | ICD-10-CM | POA: Insufficient documentation

## 2020-07-12 ENCOUNTER — Ambulatory Visit (INDEPENDENT_AMBULATORY_CARE_PROVIDER_SITE_OTHER): Payer: Medicare Other | Admitting: Cardiology

## 2020-07-12 ENCOUNTER — Encounter: Payer: Self-pay | Admitting: Cardiology

## 2020-07-12 ENCOUNTER — Other Ambulatory Visit: Payer: Self-pay

## 2020-07-12 VITALS — BP 130/64 | HR 66 | Ht 67.0 in | Wt 160.6 lb

## 2020-07-12 DIAGNOSIS — I4729 Other ventricular tachycardia: Secondary | ICD-10-CM

## 2020-07-12 DIAGNOSIS — I471 Supraventricular tachycardia: Secondary | ICD-10-CM | POA: Diagnosis not present

## 2020-07-12 DIAGNOSIS — I10 Essential (primary) hypertension: Secondary | ICD-10-CM | POA: Diagnosis not present

## 2020-07-12 DIAGNOSIS — I472 Ventricular tachycardia: Secondary | ICD-10-CM | POA: Diagnosis not present

## 2020-07-12 MED ORDER — AMIODARONE HCL 200 MG PO TABS: 200.0000 mg | ORAL_TABLET | Freq: Every day | ORAL | 3 refills | Status: DC

## 2020-07-12 NOTE — Patient Instructions (Signed)
Medication Instructions:  Your physician has recommended you make the following change in your medication: START: Amiodarone 200 mg. Take 200 mg twice a day for 7 days then 200 mg daily  *If you need a refill on your cardiac medications before your next appointment, please call your pharmacy*   Lab Work: Your physician recommends that you return for lab work: TODAY: BMET, Mag, TSH, LFTs If you have labs (blood work) drawn today and your tests are completely normal, you will receive your results only by: Marland Kitchen MyChart Message (if you have MyChart) OR . A paper copy in the mail If you have any lab test that is abnormal or we need to change your treatment, we will call you to review the results.   Testing/Procedures: Your physician has requested that you have an echocardiogram. Echocardiography is a painless test that uses sound waves to create images of your heart. It provides your doctor with information about the size and shape of your heart and how well your heart's chambers and valves are working. This procedure takes approximately one hour. There are no restrictions for this procedure.    Follow-Up: At [facility], you and your health needs are our priority.  As part of our continuing mission to provide you with exceptional heart care, we have created designated Provider Care Teams.  These Care Teams include your primary Cardiologist (physician) and Advanced Practice Providers (APPs -  Physician Assistants and Nurse Practitioners) who all work together to provide you with the care you need, when you need it.  We recommend signing up for the patient portal called "MyChart".  Sign up information is provided on this After Visit Summary.  MyChart is used to connect with patients for Virtual Visits (Telemedicine).  Patients are able to view lab/test results, encounter notes, upcoming appointments, etc.  Non-urgent messages can be sent to your provider as well.   To learn more about what you can  do with MyChart, go to [email or web address].    Your next appointment:   4 week(s)  The format for your next appointment:   In Person  Provider:   Berniece Salines, DO   Other Instructions  Echocardiogram An echocardiogram is a test that uses sound waves (ultrasound) to produce images of the heart. Images from an echocardiogram can provide important information about:  Heart size and shape.  The size and thickness and movement of your heart's walls.  Heart muscle function and strength.  Heart valve function or if you have stenosis. Stenosis is when the heart valves are too narrow.  If blood is flowing backward through the heart valves (regurgitation).  A tumor or infectious growth around the heart valves.  Areas of heart muscle that are not working well because of poor blood flow or injury from a heart attack.  Aneurysm detection. An aneurysm is a weak or damaged part of an artery wall. The wall bulges out from the normal force of blood pumping through the body. Tell a health care provider about:  Any allergies you have.  All medicines you are taking, including vitamins, herbs, eye drops, creams, and over-the-counter medicines.  Any blood disorders you have.  Any surgeries you have had.  Any medical conditions you have.  Whether you are pregnant or may be pregnant. What are the risks? Generally, this is a safe test. However, problems may occur, including an allergic reaction to dye (contrast) that may be used during the test. What happens before the test? No specific preparation is needed.  You may eat and drink normally. What happens during the test?  You will take off your clothes from the waist up and put on a hospital gown.  Electrodes or electrocardiogram (ECG)patches may be placed on your chest. The electrodes or patches are then connected to a device that monitors your heart rate and rhythm.  You will lie down on a table for an ultrasound exam. A gel will  be applied to your chest to help sound waves pass through your skin.  A handheld device, called a transducer, will be pressed against your chest and moved over your heart. The transducer produces sound waves that travel to your heart and bounce back (or "echo" back) to the transducer. These sound waves will be captured in real-time and changed into images of your heart that can be viewed on a video monitor. The images will be recorded on a computer and reviewed by your health care provider.  You may be asked to change positions or hold your breath for a short time. This makes it easier to get different views or better views of your heart.  In some cases, you may receive contrast through an IV in one of your veins. This can improve the quality of the pictures from your heart. The procedure may vary among health care providers and hospitals.   What can I expect after the test? You may return to your normal, everyday life, including diet, activities, and medicines, unless your health care provider tells you not to do that. Follow these instructions at home:  It is up to you to get the results of your test. Ask your health care provider, or the department that is doing the test, when your results will be ready.  Keep all follow-up visits. This is important. Summary  An echocardiogram is a test that uses sound waves (ultrasound) to produce images of the heart.  Images from an echocardiogram can provide important information about the size and shape of your heart, heart muscle function, heart valve function, and other possible heart problems.  You do not need to do anything to prepare before this test. You may eat and drink normally.  After the echocardiogram is completed, you may return to your normal, everyday life, unless your health care provider tells you not to do that. This information is not intended to replace advice given to you by your health care provider. Make sure you discuss any  questions you have with your health care provider. Document Revised: 12/29/2019 Document Reviewed: 12/29/2019 Elsevier Patient Education  2021 [employer].

## 2020-07-12 NOTE — Progress Notes (Signed)
Cardiology Office Note:    Date:  07/13/2020   ID:  TAYT MOYERS, DOB 17-May-1931, MRN [id number]  PCP:  Seward Carol, MD  Cardiologist:  Berniece Salines, DO  Electrophysiologist:  None   Referring MD: Seward Carol, MD   I was referred to my primary care doctor because I have a lot of skipped beats   History of Present Illness:    Aaron Chang is a 87 y.o. male with a hx of PVC, kidney stones, sleep apnea is here today to establish cardiac care at the request of his primary care provider Dr. Jori Moll polite.  The patient is here today with his wife and tells me that the reason he had asked to be seen because he had some testing done with neurology and it showed that he had a lot of irregular heart rhythm.  He then saw his PCP who placed a monitor on the patient and requested that he follow-up with cardiology.  He denies any chest pain, any shortness of breath any lightheadedness or dizziness.   Past Medical History:  Diagnosis Date  . Cervical radiculopathy 2003  . Hx of colonoscopy 2006   2011  . Hypercholesterolemia    side effects to lipitor and zocor  . Hypertension   . Kidney stone 2014  . Lumbar radiculopathy 2006  . PVC's (premature ventricular contractions)   . Severe sleep apnea 08/06/2011    Past Surgical History:  Procedure Laterality Date  . CATARACT EXTRACTION, BILATERAL  2017  . CERVICAL DISCECTOMY  2011  . ELBOW ARTHROPLASTY  2012  . FOOT SURGERY Right   . LUMBAR FUSION  2020  . ROTATOR CUFF REPAIR Right 1996  . ROTATOR CUFF REPAIR Left 2015  . TARSAL NAVICULAR ARTHODESIS  1940  . VASECTOMY  1968    Current Medications: Current Meds  Medication Sig  . amiodarone (PACERONE) 200 MG tablet Take 1 tablet (200 mg total) by mouth daily. Take 200 mg twice a day for 7 days than 200 mg daily  . ascorbic acid (VITAMIN C) 100 MG tablet Take 1 tablet by mouth daily.  Marland Kitchen aspirin EC 81 MG tablet Take 1 tablet (81 mg total) by mouth daily. Swallow whole.  .  Cholecalciferol (VITAMIN D) 125 MCG (5000 UT) CAPS Take 1 tablet by mouth daily.  . Flaxseed, Linseed, (FLAX SEED OIL) 1000 MG CAPS Take 1 capsule by mouth See admin instructions.  . hydrochlorothiazide (HYDRODIURIL) 12.5 MG tablet Take 1 tablet by mouth daily.  Marland Kitchen HYDROcodone-acetaminophen (NORCO/VICODIN) 5-325 MG tablet Take 1 tablet by mouth daily.  . mirtazapine (REMERON) 7.5 MG tablet Take 7.5 mg by mouth at bedtime.  . Multiple Vitamin (MULTI-VITAMIN) tablet Take 1 tablet by mouth daily.  . Omega-3 Fatty Acids (FISH OIL) 1200 MG CPDR Take 2 capsules by mouth daily.  . Zinc 100 MG TABS Take 1 tablet by mouth daily.     Allergies:   Chocolate flavor, Flomax [tamsulosin], Statins, Atenolol, Atorvastatin, Chocolate, Codeine, Other, and Diazepam   Social History   Socioeconomic History  . Marital status: Married    Spouse name: Not on file  . Number of children: Not on file  . Years of education: Not on file  . Highest education level: Not on file  Occupational History  . Not on file  Tobacco Use  . Smoking status: Never Smoker  . Smokeless tobacco: Never Used  Vaping Use  . Vaping Use: Never used  Substance and Sexual Activity  . Alcohol use:  Not Currently  . Drug use: Not on file  . Sexual activity: Not on file  Other Topics Concern  . Not on file  Social History Narrative  . Not on file   Social Determinants of Health   Financial Resource Strain: Not on file  Food Insecurity: Not on file  Transportation Needs: Not on file  Physical Activity: Not on file  Stress: Not on file  Social Connections: Not on file     Family History: The patient's family history is not on file.  ROS:   Review of Systems  Constitution: Negative for decreased appetite, fever and weight gain.  HENT: Negative for congestion, ear discharge, hoarse voice and sore throat.   Eyes: Negative for discharge, redness, vision loss in right eye and visual halos.  Cardiovascular: Negative for chest  pain, dyspnea on exertion, leg swelling, orthopnea and palpitations.  Respiratory: Negative for cough, hemoptysis, shortness of breath and snoring.   Endocrine: Negative for heat intolerance and polyphagia.  Hematologic/Lymphatic: Negative for bleeding problem. Does not bruise/bleed easily.  Skin: Negative for flushing, nail changes, rash and suspicious lesions.  Musculoskeletal: Negative for arthritis, joint pain, muscle cramps, myalgias, neck pain and stiffness.  Gastrointestinal: Negative for abdominal pain, bowel incontinence, diarrhea and excessive appetite.  Genitourinary: Negative for decreased libido, genital sores and incomplete emptying.  Neurological: Negative for brief paralysis, focal weakness, headaches and loss of balance.  Psychiatric/Behavioral: Negative for altered mental status, depression and suicidal ideas.  Allergic/Immunologic: Negative for HIV exposure and persistent infections.    EKGs/Labs/Other Studies Reviewed:    The following studies were reviewed today:   EKG:  The ekg ordered today demonstrates Sinus rhythm, frequent PVCs with right bundle branch block and underlying left anterior fascicular block.  ZIO monitor which was placed on him by his primary care provider on June 21, 2020.  The patient wore the ZIO monitor for 6 days 18 hours.  His minimum heart rate was 46, maximum heart rate was 197, average heart rate was 66.  He had overall 227 episodes of nonsustained ventricular tachycardia with the fastest heartbeat rate at 197 bpm longest 4 beats.  He had a total of 1684 supraventricular tachycardia with the fastest heart rate at 184 bpm and the longest episode lasting 18.6 seconds.  There were frequent PACs 5.7%.  Frequent PVCs 12%.  No pauses, no AV block, no atrial fibrillation.  ecent Labs: 07/12/2020: ALT 20; BUN 26; Creatinine, Ser 0.97; Magnesium 2.4; Potassium 4.3; Sodium 140; TSH 2.250  Recent Lipid Panel No results found for: CHOL, TRIG, HDL,  CHOLHDL, VLDL, LDLCALC, LDLDIRECT  Physical Exam:    VS:  BP 130/64 (BP Location: Right Arm)   Pulse 66   Ht 5\' 7"  (1.702 m)   Wt 160 lb 9.6 oz (72.8 kg)   SpO2 97%   BMI 25.15 kg/m     Wt Readings from Last 3 Encounters:  07/12/20 160 lb 9.6 oz (72.8 kg)  06/02/20 167 lb (75.8 kg)     GEN: Well nourished, well developed in no acute distress HEENT: Normal NECK: No JVD; No carotid bruits LYMPHATICS: No lymphadenopathy CARDIAC: S1S2 noted,RRR, no murmurs, rubs, gallops RESPIRATORY:  Clear to auscultation without rales, wheezing or rhonchi  ABDOMEN: Soft, non-tender, non-distended, +bowel sounds, no guarding. EXTREMITIES: No edema, No cyanosis, no clubbing MUSCULOSKELETAL:  No deformity  SKIN: Warm and dry NEUROLOGIC:  Alert and oriented x 3, non-focal PSYCHIATRIC:  Normal affect, good insight  ASSESSMENT:    1. NSVT (nonsustained  ventricular tachycardia) ([place])   2. PAT (paroxysmal atrial tachycardia) ([place])   3. Primary hypertension    PLAN:     We discussed his monitor results which show multiple episodes of nonsustained ventricular tachycardia as well as supraventricular tachycardia.  I discussed with the patient and his wife that given his multiple episodes and heart rate going up into the 190s will be best that we put him on medication to help him maintain sinus rhythm most of the time and a lower heart rate out of atrial tach.  We are agreeable we will try amiodarone which he was started 200 mg twice a day for 7 days and then go to 200 mg.  Also do a BMP, mag TSH as well as hepatic function on this patient. With his frequent PVCs and nonsustained ventricular tachycardia is we discussed an echocardiogram will be done to assess his LV function.  Blood pressure is acceptable, continue with current antihypertensive regimen.    The patient is in agreement with the above plan. The patient left the office in stable condition.  The patient will follow up in 4 weeks due to new  medication.   Medication Adjustments/Labs and Tests Ordered: Current medicines are reviewed at length with the patient today.  Concerns regarding medicines are outlined above.  Orders Placed This Encounter  Procedures  . Basic metabolic panel  . Magnesium  . TSH  . Hepatic function panel  . EKG 12-Lead  . ECHOCARDIOGRAM COMPLETE   Meds ordered this encounter  Medications  . amiodarone (PACERONE) 200 MG tablet    Sig: Take 1 tablet (200 mg total) by mouth daily. Take 200 mg twice a day for 7 days than 200 mg daily    Dispense:  90 tablet    Refill:  3    Patient Instructions   Medication Instructions:  Your physician has recommended you make the following change in your medication: START: Amiodarone 200 mg. Take 200 mg twice a day for 7 days then 200 mg daily  *If you need a refill on your cardiac medications before your next appointment, please call your pharmacy*   Lab Work: Your physician recommends that you return for lab work: TODAY: BMET, Mag, TSH, LFTs If you have labs (blood work) drawn today and your tests are completely normal, you will receive your results only by: Marland Kitchen MyChart Message (if you have MyChart) OR . A paper copy in the mail If you have any lab test that is abnormal or we need to change your treatment, we will call you to review the results.   Testing/Procedures: Your physician has requested that you have an echocardiogram. Echocardiography is a painless test that uses sound waves to create images of your heart. It provides your doctor with information about the size and shape of your heart and how well your heart's chambers and valves are working. This procedure takes approximately one hour. There are no restrictions for this procedure.    Follow-Up: At [facility], you and your health needs are our priority.  As part of our continuing mission to provide you with exceptional heart care, we have created designated Provider Care Teams.  These Care  Teams include your primary Cardiologist (physician) and Advanced Practice Providers (APPs -  Physician Assistants and Nurse Practitioners) who all work together to provide you with the care you need, when you need it.  We recommend signing up for the patient portal called "MyChart".  Sign up information is provided on this After  Visit Summary.  MyChart is used to connect with patients for Virtual Visits (Telemedicine).  Patients are able to view lab/test results, encounter notes, upcoming appointments, etc.  Non-urgent messages can be sent to your provider as well.   To learn more about what you can do with MyChart, go to [email or web address].    Your next appointment:   4 week(s)  The format for your next appointment:   In Person  Provider:   Berniece Salines, DO   Other Instructions  Echocardiogram An echocardiogram is a test that uses sound waves (ultrasound) to produce images of the heart. Images from an echocardiogram can provide important information about:  Heart size and shape.  The size and thickness and movement of your heart's walls.  Heart muscle function and strength.  Heart valve function or if you have stenosis. Stenosis is when the heart valves are too narrow.  If blood is flowing backward through the heart valves (regurgitation).  A tumor or infectious growth around the heart valves.  Areas of heart muscle that are not working well because of poor blood flow or injury from a heart attack.  Aneurysm detection. An aneurysm is a weak or damaged part of an artery wall. The wall bulges out from the normal force of blood pumping through the body. Tell a health care provider about:  Any allergies you have.  All medicines you are taking, including vitamins, herbs, eye drops, creams, and over-the-counter medicines.  Any blood disorders you have.  Any surgeries you have had.  Any medical conditions you have.  Whether you are pregnant or may be pregnant. What are  the risks? Generally, this is a safe test. However, problems may occur, including an allergic reaction to dye (contrast) that may be used during the test. What happens before the test? No specific preparation is needed. You may eat and drink normally. What happens during the test?  You will take off your clothes from the waist up and put on a hospital gown.  Electrodes or electrocardiogram (ECG)patches may be placed on your chest. The electrodes or patches are then connected to a device that monitors your heart rate and rhythm.  You will lie down on a table for an ultrasound exam. A gel will be applied to your chest to help sound waves pass through your skin.  A handheld device, called a transducer, will be pressed against your chest and moved over your heart. The transducer produces sound waves that travel to your heart and bounce back (or "echo" back) to the transducer. These sound waves will be captured in real-time and changed into images of your heart that can be viewed on a video monitor. The images will be recorded on a computer and reviewed by your health care provider.  You may be asked to change positions or hold your breath for a short time. This makes it easier to get different views or better views of your heart.  In some cases, you may receive contrast through an IV in one of your veins. This can improve the quality of the pictures from your heart. The procedure may vary among health care providers and hospitals.   What can I expect after the test? You may return to your normal, everyday life, including diet, activities, and medicines, unless your health care provider tells you not to do that. Follow these instructions at home:  It is up to you to get the results of your test. Ask your health care provider, or the department  that is doing the test, when your results will be ready.  Keep all follow-up visits. This is important. Summary  An echocardiogram is a test that uses  sound waves (ultrasound) to produce images of the heart.  Images from an echocardiogram can provide important information about the size and shape of your heart, heart muscle function, heart valve function, and other possible heart problems.  You do not need to do anything to prepare before this test. You may eat and drink normally.  After the echocardiogram is completed, you may return to your normal, everyday life, unless your health care provider tells you not to do that. This information is not intended to replace advice given to you by your health care provider. Make sure you discuss any questions you have with your health care provider. Document Revised: 12/29/2019 Document Reviewed: 12/29/2019 Elsevier Patient Education  2021 [employer].      Adopting a Healthy Lifestyle.  Know what a healthy weight is for you (roughly BMI <25) and aim to maintain this   Aim for 7+ servings of fruits and vegetables daily   65-80+ fluid ounces of water or unsweet tea for healthy kidneys   Limit to max 1 drink of alcohol per day; avoid smoking/tobacco   Limit animal fats in diet for cholesterol and heart health - choose grass fed whenever available   Avoid highly processed foods, and foods high in saturated/trans fats   Aim for low stress - take time to unwind and care for your mental health   Aim for 150 min of moderate intensity exercise weekly for heart health, and weights twice weekly for bone health   Aim for 7-9 hours of sleep daily   When it comes to diets, agreement about the perfect plan isnt easy to find, even among the experts. Experts at the [employer] developed an idea known as the Healthy Eating Plate. Just imagine a plate divided into logical, healthy portions.   The emphasis is on diet quality:   Load up on vegetables and fruits - one-half of your plate: Aim for color and variety, and remember that potatoes dont count.   Go for whole grains -  one-quarter of your plate: Whole wheat, barley, wheat berries, quinoa, oats, brown rice, and foods made with them. If you want pasta, go with whole wheat pasta.   Protein power - one-quarter of your plate: Fish, chicken, beans, and nuts are all healthy, versatile protein sources. Limit red meat.   The diet, however, does go beyond the plate, offering a few other suggestions.   Use healthy plant oils, such as olive, canola, soy, corn, sunflower and peanut. Check the labels, and avoid partially hydrogenated oil, which have unhealthy trans fats.   If youre thirsty, drink water. Coffee and tea are good in moderation, but skip sugary drinks and limit milk and dairy products to one or two daily servings.   The type of carbohydrate in the diet is more important than the amount. Some sources of carbohydrates, such as vegetables, fruits, whole grains, and beans-are healthier than others.   Finally, stay active  Signed, Berniece Salines, DO  07/13/2020 10:00 AM    [facility]

## 2020-07-13 DIAGNOSIS — I472 Ventricular tachycardia: Secondary | ICD-10-CM | POA: Insufficient documentation

## 2020-07-13 DIAGNOSIS — Z5181 Encounter for therapeutic drug level monitoring: Secondary | ICD-10-CM | POA: Diagnosis not present

## 2020-07-13 DIAGNOSIS — G8929 Other chronic pain: Secondary | ICD-10-CM | POA: Diagnosis not present

## 2020-07-13 DIAGNOSIS — I4729 Other ventricular tachycardia: Secondary | ICD-10-CM | POA: Insufficient documentation

## 2020-07-13 DIAGNOSIS — I471 Supraventricular tachycardia: Secondary | ICD-10-CM | POA: Insufficient documentation

## 2020-07-13 DIAGNOSIS — I10 Essential (primary) hypertension: Secondary | ICD-10-CM | POA: Insufficient documentation

## 2020-07-13 DIAGNOSIS — M48061 Spinal stenosis, lumbar region without neurogenic claudication: Secondary | ICD-10-CM | POA: Diagnosis not present

## 2020-07-13 LAB — BASIC METABOLIC PANEL
BUN/Creatinine Ratio: 27 — ABNORMAL HIGH (ref 10–24)
BUN: 26 mg/dL (ref 8–27)
CO2: 23 mmol/L (ref 20–29)
Calcium: 9.4 mg/dL (ref 8.6–10.2)
Chloride: 98 mmol/L (ref 96–106)
Creatinine, Ser: 0.97 mg/dL (ref 0.76–1.27)
GFR calc Af Amer: 81 mL/min/{1.73_m2} (ref >59)
GFR calc non Af Amer: 70 mL/min/{1.73_m2} (ref >59)
Glucose: 122 mg/dL — ABNORMAL HIGH (ref 65–99)
Potassium: 4.3 mmol/L (ref 3.5–5.2)
Sodium: 140 mmol/L (ref 134–144)

## 2020-07-13 LAB — HEPATIC FUNCTION PANEL
ALT: 20 IU/L (ref 0–44)
AST: 19 IU/L (ref 0–40)
Albumin: 4.2 g/dL (ref 3.6–4.6)
Alkaline Phosphatase: 101 IU/L (ref 44–121)
Bilirubin Total: 1.3 mg/dL — ABNORMAL HIGH (ref 0.0–1.2)
Bilirubin, Direct: 0.26 mg/dL (ref 0.00–0.40)
Total Protein: 6.7 g/dL (ref 6.0–8.5)

## 2020-07-13 LAB — TSH: TSH: 2.25 u[IU]/mL (ref 0.450–4.500)

## 2020-07-13 LAB — MAGNESIUM: Magnesium: 2.4 mg/dL — ABNORMAL HIGH (ref 1.6–2.3)

## 2020-07-26 DIAGNOSIS — R0902 Hypoxemia: Secondary | ICD-10-CM | POA: Diagnosis not present

## 2020-07-27 ENCOUNTER — Other Ambulatory Visit: Payer: Self-pay

## 2020-07-27 ENCOUNTER — Encounter: Payer: Self-pay | Admitting: Counselor

## 2020-07-27 ENCOUNTER — Ambulatory Visit (INDEPENDENT_AMBULATORY_CARE_PROVIDER_SITE_OTHER): Payer: Medicare Other | Admitting: Counselor

## 2020-07-27 ENCOUNTER — Ambulatory Visit: Payer: Medicare Other | Admitting: Psychology

## 2020-07-27 DIAGNOSIS — F0390 Unspecified dementia without behavioral disturbance: Secondary | ICD-10-CM

## 2020-07-27 DIAGNOSIS — F09 Unspecified mental disorder due to known physiological condition: Secondary | ICD-10-CM

## 2020-07-27 DIAGNOSIS — F03A0 Unspecified dementia, mild, without behavioral disturbance, psychotic disturbance, mood disturbance, and anxiety: Secondary | ICD-10-CM

## 2020-07-27 NOTE — Progress Notes (Signed)
° °  Psychometrist Note   Cognitive testing was administered to Aaron Chang by Milana Kidney, B.S. (Technician) under the supervision of Alphonzo Severance, Psy.D., ABN. [name] was able to tolerate all test procedures. Dr. Nicole Kindred met with the patient as needed to manage any emotional reactions to the testing procedures. Rest breaks were offered.    The battery of tests administered was selected by Dr. Nicole Kindred with consideration to the patient's current level of functioning, the nature of his symptoms, emotional and behavioral responses during the interview, level of literacy, observed level of motivation/effort, and the nature of the referral question. This battery was communicated to the psychometrist. Communication between Dr. Nicole Kindred and the psychometrist was ongoing throughout the evaluation and Dr. Nicole Kindred was immediately accessible at all times. Dr. Nicole Kindred provided supervision to the technician on the date of this service, to the extent necessary to assure the quality of all services provided.    [name] will return in approximately one week for an interactive feedback session with Dr. Nicole Kindred, at which time test performance, clinical impressions, and treatment recommendations will be reviewed in detail. The patient understands he can contact our office should he require our assistance before this time.   A total of 95 minutes of billable time were spent with Aaron Chang by the technician, including test administration and scoring time. Billing for these services is reflected in Dr. Les Pou note.   This note reflects time spent with the psychometrician and does not include test scores, clinical history, or any interpretations made by Dr. Nicole Kindred. The full report will follow in a separate note.

## 2020-07-27 NOTE — Progress Notes (Signed)
[facility]  Patient Name: Aaron Chang MRN: [id number] Date of Birth: January 18, 1931 Age: 87 y.o. Education: 12 years  Referral Circumstances and Background Information  Mr. Aaron Chang is a 87 y.o., right-hand dominant, married man with a history of possible TIA, staring episodes, and memory loss. He was referred by Dr. Carles Collet, with our multidisciplinary movement disorders clinic.    On interview, the patient reported "maybe a little" when I asked if he has problems with memory and thinking. His wife reported that he does have some problems, although she thinks it is better since stopping the gabapentin and starting the aspirin, as directed by Dr. Carles Collet. The first changes his wife noticed were around 2 years ago, he started with forgetfulness. His wife was working before [place] and he had forgotten how to make something that he used to make all the time. She thinks there has been some mild worsening over time. On specific review of symptoms, she reported that he does forget things she tells him and he can ask her the same questions multiple times. He doesn't repeat himself much. He presents as somewhat reserved so it's possible his difficulties are not that notable to her. He has word finding problems but she isn't sure if it is more than other people his age. He doesn't lose the month or the year. The patient himself acknowledged that he has a hard time keeping track of doctors appointments. Behaviorally, his wife reported that he is "very calm, easygoing, and things don't bother him." He is somewhat more gruff than he used to be with her, but otherwise there are no major behavior changes. In general he sleeps a lot and does not seem to be as alert. They denied any odd or inappropriate behaviors, collecting, hoarding, strange new eating habits, although he does have apathy and sits much of the day. He has noticed a decline in energy over the past 6 months or so. The  patient himself reported that his mood is good and he generally feels content. He doesn't read as much as he used to but he still paints a bit and that goes well. He usually sleeps around 9 hours a night and then 3 hours in the day. He has had some minor dream enactment behavior, where he has "jumped" or been talking in his sleep. "He thought something was going on and didn't realize he was asleep." This doesn't happen often, maybe 2 or 3 times in the past 6 months. He stated that he has lost a bit of weight, over the past year (his wife things it was after his daughter died). He was having myoclonic type jerking, felt to be likely related to the gabapentin by Dr. Carles Collet.   With respect to functioning, the patient is still able to do some things around the house, although he has had some functional decline. His wife reported that he can no longer balance his checkbook and she is "helping" him. He can still drive, they think, although his wife said that she  always accompanies him. She denied that she is worried about his driving, however, but then she also stated that he "scares" her sometimes because he rubbernecks and will drift towards where he is looking. The patient's wife said that he is taking his own medications, but it sounds like he does forget his evening medications and she will help with that. The patient was able to tell me most of his medications. He is able to  cook basic meals and makes breakfast every morning, he makes sausage, potatoes, and onions. He also has a few signature dishes he can still prepare. His wife thinks that he needs more help than he used to making decisions and solving problems. There are no changes in basic hygiene, showering, toileting, or the like.   Past Medical History and Review of Relevant Studies   Patient Active Problem List   Diagnosis Date Noted  . NSVT (nonsustained ventricular tachycardia) ([facility]) 07/13/2020  . PAT (paroxysmal atrial tachycardia) ([place]) 07/13/2020   . Primary hypertension 07/13/2020  . Adjustment disorder with depressed mood 07/11/2020  . Grieving 07/11/2020  . Low back pain 07/11/2020  . Personal history of colonic polyps 07/11/2020  . Pure hypercholesterolemia   . Hypertension 06/17/2020  . Body mass index (BMI) 25.0-25.9, adult 06/17/2020  . Lumbar post-laminectomy syndrome 11/07/2018  . Supraventricular premature beats 07/09/2018  . Atherosclerosis of arteries 07/09/2018  . Benign prostatic hyperplasia without lower urinary tract symptoms 07/09/2018  . Diverticulosis of colon 07/09/2018  . Encounter for physical therapy 07/09/2018  . Inguinal hernia 07/09/2018  . Left bundle branch hemiblock 07/09/2018  . Surgical or procedure not carried out because of patient's decision 07/09/2018  . Symptoms referable to shoulder joint 07/09/2018  . Ureteric stone 07/09/2018  . Hydronephrosis 2014  . Obstructive sleep apnea (adult) (pediatric) 08/06/2011  . Lumbar radiculopathy 2006  . Hx of colonoscopy 2006  . Thoracic spondylosis 2003   Review of Neuroimaging and Relevant Medical History: The patient denied any history of significant head injuries, seizures, strokes, neurological surgeries or the like.   Patient has an MRI brain from 07/02/2020, which shows mild to moderate volume loss although he is 89. The pattern is generalized but if anything, there is more loss in the frontal and temporal areas than the posterior portions of the brain. There is hippocampal atrophy and it may be just a bit more than in other areas. He has mild+ to moderate mainly periventricular areas of confluent leukoaraiosis. There are some additional mixed areas of signal change likely due to a mix of old lacunar type infarcts and small vessel disease, with small cortically based infarct in the high right frontal lobe.   Current Outpatient Medications  Medication Sig Dispense Refill  . amiodarone (PACERONE) 200 MG tablet Take 1 tablet (200 mg total) by mouth  daily. Take 200 mg twice a day for 7 days than 200 mg daily 90 tablet 3  . ascorbic acid (VITAMIN C) 100 MG tablet Take 1 tablet by mouth daily.    Marland Kitchen aspirin EC 81 MG tablet Take 1 tablet (81 mg total) by mouth daily. Swallow whole. 30 tablet 11  . Cholecalciferol (VITAMIN D) 125 MCG (5000 UT) CAPS Take 1 tablet by mouth daily.    . Flaxseed, Linseed, (FLAX SEED OIL) 1000 MG CAPS Take 1 capsule by mouth See admin instructions.    . hydrochlorothiazide (HYDRODIURIL) 12.5 MG tablet Take 1 tablet by mouth daily.    Marland Kitchen HYDROcodone-acetaminophen (NORCO/VICODIN) 5-325 MG tablet Take 1 tablet by mouth daily.    . mirtazapine (REMERON) 7.5 MG tablet Take 7.5 mg by mouth at bedtime.    . Multiple Vitamin (MULTI-VITAMIN) tablet Take 1 tablet by mouth daily.    . Omega-3 Fatty Acids (FISH OIL) 1200 MG CPDR Take 2 capsules by mouth daily.    . Zinc 100 MG TABS Take 1 tablet by mouth daily.     No current facility-administered medications for this visit.  No family history on file.  There is a family history of dementia, his mother had "major mental issues" and would get confused when she got older, around the 36s. There is no  family history of psychiatric illness.  Psychosocial History  Developmental, Educational and Employment History: Th epatient is a native [employer]. He denied that he had any problems in school, he earned B's and C's. 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 He retired 26 years ago, around 87 years of age.   Psychiatric History: None, although he has had some issues after his daughter passed.   Substance Use History: The patient doesn't drink regularly. He has never used tobacco products.   Relationship History and Living Cimcumstances: The patient has been married twice. He was married 64 years the first time, his wife died of cancer. He re-married and has been with his current wife for 16 years. He has a daughter who  unfortunately passed about 2 years ago and he has a son who live sin the [place] area.   Mental Status and Behavioral Observations  Sensorium/Arousal: The patient's level of arousal was awake and alert. Hearing and vision were adequate for testing purposes. He did ask for repetition sometimes.  Orientation: The patient was oriented to person, place, situation, and generally to time although he reported the year as 2002. He was aware of the current and past president.  Appearance: Dressed in appropriate, casual clothing with reasonable grooming and hygiene.  Behavior: Appropriate, somewhat reserved throughout the evaluation and would often defer to wife.  Speech/language: Speech was normal in rate, rhythm, volume, and prosody.  Gait/Posture: Gait was glexed at the waist. Appear to have diminished arm swing on the right in clinic today when changing rooms.  Movement: No overt signs or symptoms of movement disorder such as tremor, clear bradykinesia.  Social Comportment: Pleasant and appropriate within social norms.  Mood: "I'm good."  Affect: Euthymic Thought process/content: Thought process was generally logical and goal oriented although he seemed to miss things in conversation and would ask for repetition fairly often. No bizarre or inappropriate thought content. Did ask wife to help with historical details.  Safety: No safety concerns identified in this euthymic patient.  Insight: Questionable  MMSE - Mini Mental State Exam 07/27/2020  Orientation to time 4  Orientation to Place 5  Registration 3  Attention/ Calculation 3  Recall 2  Language- name 2 objects 2  Language- repeat 0  Language- follow 3 step command 3  Language- read & follow direction 1  Write a sentence 1  Copy design 1  Total score 25   Test Procedures  Wide Range Achievement Test - 4             Word Reading Neuropsychological Assessment Battery  Naming Repeatable Battery for the Assessment of Neuropsychological  Status (Form A) Controlled Oral Word Association (F-A-S) Semantic Fluency (Animals) Trail Making Test A & B Complex Ideational Material Clock Drawing Modified [place] Card Sorting Test Geriatric Depression Scale - Short Form Quick Dementia Rating System (completed by wife, [place])  Plan  RODDERICK HOLTZER was seen for a psychiatric diagnostic evaluation and neuropsychological testing. He is a pleasant, 87 year old, right-hand dominant man with a history of memory difficulties over perhaps the past 2 years as per his wife. His difficulties are fairly mild but he does have some mild functional limitations, he has forgotten recipes he used to cook all  the time and he also is now having problems doing the checkbook. He was referred by Dr. Carles Collet and I note that she was concerned about his driving, which will be addressed with the patient pending his test data. On mental status testing he is somewhere between an MCI to perhaps very mild dementia level problem and does seem to have some insight if not full appreciation. Full and complete note with impressions, recommendations, and interpretation of test data to follow.   Viviano Simas Nicole Kindred, PsyD, [place] Clinical Neuropsychologist  Informed Consent  Risks and benefits of the evaluation were discussed with the patient prior to all testing procedures. I conducted a clinical interview and neuropsychological testing (at least two tests) with Yvonna Alanis and Milana Kidney, B.S. (Technician) administered additional test procedures. The patient was able to tolerate the testing procedures and the patient (and/or family if applicable) is likely to benefit from further follow up to receive the diagnosis and treatment recommendations, which will be rendered at the next encounter.

## 2020-07-28 NOTE — Progress Notes (Signed)
[facility]  Patient Name: Aaron Chang MRN: [id number] Date of Birth: Feb 21, 1931 Age: 87 y.o. Education: 12 years  Measurement properties of test scores: IQ, Index, and Standard Scores (SS): Mean = 100; Standard Deviation = 15 Scaled Scores (Ss): Mean = 10; Standard Deviation = 3 Z scores (Z): Mean = 0; Standard Deviation = 1 T scores (T); Mean = 50; Standard Deviation = 10  TEST SCORES:    Note: This summary of test scores accompanies the interpretive report and should not be interpreted by unqualified individuals or in isolation without reference to the report. Test scores are relative to age, gender, and educational history as available and appropriate.   Mental Status Screening     Total Score Descriptor  MMSE 23 Mild Dementia      Expected Functioning        Wide Range Achievement Test (Word Reading): Standard/Scaled Score Percentile       Word Reading 93 32      Cognitive Testing        RBANS, Form : Standard/Scaled Score Percentile  Total Score 70 2  Immediate Memory 65 1      List Learning 3 1      Story Memory 5 5  Visuospatial/Constructional 102 55      Figure Copy   (19) 12 75      Judgment of Line Orientation   (15) --- 26-50  Language 60 <1      Picture Naming --- 3-9      Semantic Fluency 2 <1  Attention 64 1      Digit Span 6 9      Coding 3 1  Delayed Memory 93 32      List Recall   (2) --- 17-25      List Recognition   (19) --- 26-50      Story Recall   (5) 7 16      Figure Recall   (6) 6 9      Neuropsychological Assessment Battery (Language Module): T-score Percentile      Naming   (31) 63 91      Verbal Fluency: T-score Percentile      Controlled Oral Word Association (F-A-S) 32 4      Semantic Fluency (Animals) 20 <1      Trail Making Test: T-Score Percentile      Part A 45 31      Part B 39 14      Modified [place] Card Sorting Test (MWCST): Standard/T-Score Percentile      Number of  Categories Correct DC DC      Number of Perseverative Errors DC DC      Number of Total Errors DC DC      Percent Perseverative Errors DC DC  Executive Function Composite DC DC      [place] Diagnostic Aphasia Exam: Raw Score Scaled Score      Complex Ideational Material 10 7      Clock Drawing Raw Score Descriptor      Command 7 Mild Impairment      Rating Scales            Clinical Dementia Rating Raw Score Descriptor      Sum of Boxes 2.5 Very Mild Dementia      Global Score 0.5 MCI      Quick Dementia Rating System Raw Score Descriptor      Sum of Boxes 3.5 Very Mild  Dementia      Total Score 8 Mild Dementia  Geriatric Depression Scale - Short Form 0 Negative    Hung Rhinesmith V. Nicole Kindred PsyD, [place] Clinical Neuropsychologist

## 2020-07-29 NOTE — Progress Notes (Signed)
[facility]  Patient Name: Aaron Chang MRN: [id number] Date of Birth: 1931-01-03 Age: 87 y.o. 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 Wells Guiles Tat with our [facility]. He admits to some memory problems, his wife has noticed changes over perhaps the past 2 years. His changes started with forgetfulness. He has some memory loss but it is not profound and otherwise seems to be doing fairly well. He does have som functional impairment, she is now "helping" with the finances. She is not concerned about his driving safety but does notice that he has a tendency to rubberneck and drift while doing so, from time to time. He has no accidents or near misses. MRI of the brain shows moderate volume loss that if anything is a bit more pronounced in the frontal and temporal areas with mild + leukoaraiosis.   Neuropsychological testing shows expected average overall functioning and unusually low overall cognitive test performance, suggestive of some decline. He had difficulties with encoding aspects of memory but generally retained information fairly well over time. My sense is that his memory difficulties are still mild enough they may not be being optimally picked given the instrumentation utilized in lieu of his age. He did have low semantic as compared to phonemic verbal fluency and an unusually low score overall for attention on the RBANS. His naming is still well preserved and notably, he did well on the challenging trail making B test. He screened negative for the presence of depression and was rated as functioning at a very mild to mild dementia level by his wife. His CDR places him in the "very mild" range.   Aaron Chang is thus  demonstrating some degree of cognitive impairment although it appears quite mild. Very mild dementia presents as the best diagnosis, because he does have some functional limitation (difficulty with finances and cooking). At his age, the chances are overwhelmingly in favor of an Alzheimer's etiology and his test data are not inconsistent with that possibility, although delayed recall was better than typical. His difficulties are mild and my sense is that they were not optimally sampled on the test battery employed. Will counsel the patient about diet and exercise for managing cognitive impairment. Will stress with him the importance of staying active, because his activities are somewhat restricted, and I think that may also be masking some decline. There are no indications he is necessarily unsafe to drive although on the road testing would be best if concerns remain following the evaluation.    Diagnostic Impressions: Alzheimer's dementia, late onset, without behavioral disturbance   Recommendations to be discussed with patient  Your performance and presentation on assessment were consistent with your self-report, in that you do seem to have problems with memory and on select language measures. Because you do have some functional decline (not doing checkbook) I think that dementia is the best term for your problems although it is currently at a very mild level of progression. That means your cognitive abilities are holding up fairly well but you have some day-to-day impairment.   Dementia refers to a group of syndromes where multiple areas of ability are damaged in the brain, such as memory, thinking, judgment, and behavior, and most commonly refers to age related causes of dementia that cause worsening  in these abilities over time. Alzheimer's disease is the most common form of dementia in people over the age of 85. Not all dementias are Alzheimer's disease, but all Alzheimer's disease is dementia. When  dementia is due to an underlying condition affecting the brain, such as Alzheimer's disease, there is progression over time, which typically proceeds gradually over many years.   There is now good quality evidence from at least one large scale study that a modified mediterranean diet may help slow cognitive decline. This is known as the "MIND" diet. The Mind diet is not so much a specific diet as it is a set of recommendations for things that you should and should not eat.   Foods that are ENCOURAGED on the MIND Diet:  Green, leafy vegetables: Aim for six or more servings per week. This includes kale, spinach, cooked greens and salads.  All other vegetables: Try to eat another vegetable in addition to the green leafy vegetables at least once a day. It is best to choose non-starchy vegetables because they have a lot of nutrients with a low number of calories.  Berries: Eat berries at least twice a week. There is a plethora of research on strawberries, and other berries such as blueberries, raspberries and blackberries have also been found to have antioxidant and brain health benefits.  Nuts: Try to get five servings of nuts or more each week. The creators of the [place] don't specify what kind of nuts to consume, but it is probably best to vary the type of nuts you eat to obtain a variety of nutrients. Peanuts are a legume and do not fall into this category.  Olive oil: Use olive oil as your main cooking oil. There may be other heart-healthy alternatives such as algae oil, though there is not yet sufficient research upon which to base a formal recommendation.  Whole grains: Aim for at least three servings daily. Choose minimally processed grains like oatmeal, quinoa, brown rice, whole-wheat pasta and 100% whole-wheat bread.  Fish: Eat fish at least once a week. It is best to choose fatty fish like salmon, sardines, trout, tuna and mackerel for their high amounts of omega-3 fatty acids.  Beans: Include  beans in at least four meals every week. This includes all beans, lentils and soybeans.  Poultry: Try to eat chicken or [place] at least twice a week. Note that fried chicken is not encouraged on the MIND diet.  Wine: Aim for no more than one glass of alcohol daily. Both red and white wine may benefit the brain. However, much research has focused on the red wine compound resveratrol, which may help protect against Alzheimer's disease.  Foods that are DISCOURAGED on the MIND Diet: Butter and margarine: Try to eat less than 1 tablespoon (about 14 grams) daily. Instead, try using olive oil as your primary cooking fat, and dipping your bread in olive oil with herbs.  Cheese: The MIND diet recommends limiting your cheese consumption to less than once per week.  Red meat: Aim for no more than three servings each week. This includes all beef, pork, lamb and products made from these meats.  Maceo Pro food: The MIND diet highly discourages fried food, especially the kind from fast-food restaurants. Limit your consumption to less than once per week.  Pastries and sweets: This includes most of the processed junk food and desserts you can think of. Ice cream, cookies, brownies, snack cakes, donuts, candy and more. Try to limit these to no more than  four times a week.  Exercise is one of the best medicines for promoting health and maintaining cognitive fitness at all stages in life. Exercise probably has the largest documented effect on brain health and performance of any lifestyle intervention. Studies have shown that even previously sedentary individuals who start exercising as late as age 28 show a significant survival benefit as compared to their non-exercising peers. In the [place], the current guidelines are for 30 minutes of moderate exercise per day, but increasing your activity level less than that may also be helpful. You do not have to get your 30 minutes of exercise in one shot and exercising for short  periods of time spread throughout the day can be helpful. Go for several walks, learn to dance, or do something else you enjoy that gets your body moving. Of course, if you have an underlying medical condition or there is any question about whether it is safe for you to exercise, you should consult a medical treatment provider prior to beginning exercise.   Dr. Carles Collet was concerned somewhat about your driving. I do not think there are test findings suggesting you are clearly unsafe to drive, although given the presence of cognitive impairment and some low test scores, on the road testing could be ordered if that remains a concern. This would be the best way of ascertaining if you are safe to drive.   Test Findings  Test scores are summarized in additional documentation associated with this encounter. Test scores are relative to age, gender, and educational history as available and appropriate. There were no concerns about performance validity as all findings fell within normal expectations.   General Intellectual Functioning/Achievement:  Performance on single word reading was toward the low end of the average range with average presenting as a reasonable standard of comparison for this patient;s cognitive difficulties.   Attention and Processing Efficiency:  Indicators of attention and processing efficiency showed extremely low performance at an index level. His digit repetition forward was unusually low and he generated an extremely low score on timed number-symbol coding. Performance on simple numeric sequencing was average (but that is a very easy test).   Language: Performance on language measures showed reduced, extremely low semantic as compared with category fluency (the latter of which was unusually low). Visual object confrontation naming was errorless.   Visuospatial Function: Performance in this area represented a significant strength for Aaron Chang who score at a normal, average level on  both constructional and perceptual indicators.   Learning and Memory: Performancve on measures within this domain was generally WNL, with an average sore on the overall index. Nevertheless, he does have an encoding issue that may represent a developing storage problem.   In the verbal realm, Aaron Chang learned 2, 3, 3, and 5 words across four learning trials of a 10-item list, which is extremely low. He learned an unusually low number of details from a short story and carried them over to short-delayed recall. With standard delays, he did better and was able to remember much of the information he had initially encoded. List delayed recall and recognition were low average to average, respectively. Short story memory was low average. Performance was unusually low when recalling a modestly complex geometric figure in the visual realm  Executive Functions: Some level of executive dysfunction is suspected besides fairly good performance on most of the measures within this domain. Generation of words in response to letters was unusually low. By contrast, alternating sequencing of numbers  and letters of the alphabet and reasoning with modestly complex verbal information was low average. Clock drawing was suggestive of "Mild Impairment," with a sloppy face and incorrect hand placement.   Rating Scale(s): Aaron Chang screened negative for the presence of depression. His wife characterized him as functioning at a very mild to mild dementia level. I was able to rate a CDR for him with a Global score of 0.5 and a Sum of Boxes of 2.5, although I do think he meets clinical criteria for dementia. It is very mild at this point in time.   Aaron Simas Nicole Kindred, PsyD, ABN Clinical Neuropsychologist  Coding and Compliance  Billing below reflects technician time, my direct face-to-face time with the patient, time spent in test administration, and time spent in professional activities including but not limited to:  neuropsychological test interpretation, integration of neuropsychological test data with clinical history, report preparation, treatment planning, care coordination, and review of diagnostically pertinent medical history or studies.   Services associated with this encounter: Clinical Interview [phone]) plus 170 minutes (96132/96133; Neuropsychological Evaluation by Professional)  21 minutes (96136/96137; Test Administration by Professional) 95 minutes (96138/96139; Neuropsychological Testing by Technician)

## 2020-08-03 ENCOUNTER — Encounter: Payer: Self-pay | Admitting: Counselor

## 2020-08-03 ENCOUNTER — Ambulatory Visit (INDEPENDENT_AMBULATORY_CARE_PROVIDER_SITE_OTHER): Payer: Medicare Other | Admitting: Counselor

## 2020-08-03 ENCOUNTER — Other Ambulatory Visit: Payer: Self-pay

## 2020-08-03 DIAGNOSIS — F0280 Dementia in other diseases classified elsewhere without behavioral disturbance: Secondary | ICD-10-CM

## 2020-08-03 DIAGNOSIS — G301 Alzheimer's disease with late onset: Secondary | ICD-10-CM | POA: Diagnosis not present

## 2020-08-03 NOTE — Patient Instructions (Signed)
Your performance and presentation on assessment were consistent with your self-report, in that you do seem to have problems with memory and on select language measures. Because you do have some functional decline (not doing checkbook) I think that dementia is the best term for your problems although it is currently at a very mild level of progression. That means your cognitive abilities are holding up fairly well but you have some day-to-day impairment.   Dementia refers to a group of syndromes where multiple areas of ability are damaged in the brain, such as memory, thinking, judgment, and behavior, and most commonly refers to age related causes of dementia that cause worsening in these abilities over time. Alzheimer's disease is the most common form of dementia in people over the age of 58. Not all dementias are Alzheimer's disease, but all Alzheimer's disease is dementia. When dementia is due to an underlying condition affecting the brain, such as Alzheimer's disease, there is progression over time, which typically proceeds gradually over many years.   There is now good quality evidence from at least one large scale study that a modified mediterranean diet may help slow cognitive decline. This is known as the "MIND" diet. The Mind diet is not so much a specific diet as it is a set of recommendations for things that you should and should not eat.   Foods that are ENCOURAGED on the MIND Diet:  Green, leafy vegetables: Aim for six or more servings per week. This includes kale, spinach, cooked greens and salads.  All other vegetables: Try to eat another vegetable in addition to the green leafy vegetables at least once a day. It is best to choose non-starchy vegetables because they have a lot of nutrients with a low number of calories.  Berries: Eat berries at least twice a week. There is a plethora of research on strawberries, and other berries such as blueberries, raspberries and blackberries have also  been found to have antioxidant and brain health benefits.  Nuts: Try to get five servings of nuts or more each week. The creators of the [place] don't specify what kind of nuts to consume, but it is probably best to vary the type of nuts you eat to obtain a variety of nutrients. Peanuts are a legume and do not fall into this category.  Olive oil: Use olive oil as your main cooking oil. There may be other heart-healthy alternatives such as algae oil, though there is not yet sufficient research upon which to base a formal recommendation.  Whole grains: Aim for at least three servings daily. Choose minimally processed grains like oatmeal, quinoa, brown rice, whole-wheat pasta and 100% whole-wheat bread.  Fish: Eat fish at least once a week. It is best to choose fatty fish like salmon, sardines, trout, tuna and mackerel for their high amounts of omega-3 fatty acids.  Beans: Include beans in at least four meals every week. This includes all beans, lentils and soybeans.  Poultry: Try to eat chicken or [place] at least twice a week. Note that fried chicken is not encouraged on the MIND diet.  Wine: Aim for no more than one glass of alcohol daily. Both red and white wine may benefit the brain. However, much research has focused on the red wine compound resveratrol, which may help protect against Alzheimer's disease.  Foods that are DISCOURAGED on the MIND Diet: Butter and margarine: Try to eat less than 1 tablespoon (about 14 grams) daily. Instead, try using olive oil as your primary  cooking fat, and dipping your bread in olive oil with herbs.  Cheese: The MIND diet recommends limiting your cheese consumption to less than once per week.  Red meat: Aim for no more than three servings each week. This includes all beef, pork, lamb and products made from these meats.  Maceo Pro food: The MIND diet highly discourages fried food, especially the kind from fast-food restaurants. Limit your consumption to less than once  per week.  Pastries and sweets: This includes most of the processed junk food and desserts you can think of. Ice cream, cookies, brownies, snack cakes, donuts, candy and more. Try to limit these to no more than four times a week.  Exercise is one of the best medicines for promoting health and maintaining cognitive fitness at all stages in life. Exercise probably has the largest documented effect on brain health and performance of any lifestyle intervention. Studies have shown that even previously sedentary individuals who start exercising as late as age 69 show a significant survival benefit as compared to their non-exercising peers. In the [place], the current guidelines are for 30 minutes of moderate exercise per day, but increasing your activity level less than that may also be helpful. You do not have to get your 30 minutes of exercise in one shot and exercising for short periods of time spread throughout the day can be helpful. Go for several walks, learn to dance, or do something else you enjoy that gets your body moving. Of course, if you have an underlying medical condition or there is any question about whether it is safe for you to exercise, you should consult a medical treatment provider prior to beginning exercise.   Dr. Carles Collet was concerned somewhat about your driving. I do not think there are test findings suggesting you are clearly unsafe to drive, although given the presence of cognitive impairment and some low test scores, on the road testing could be ordered if that remains a concern. This would be the best way of ascertaining if you are safe to drive.

## 2020-08-03 NOTE — Progress Notes (Signed)
NEUROPSYCHOLOGY FEEDBACK NOTE [facility]  Feedback Note: I met with Yvonna Alanis to review the findings resulting from his neuropsychological evaluation. Since the last appointment, he has been about the same. 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 Time was spent reviewing the impressions and recommendations that are detailed in the evaluation report. We discussed impression of very mild dementia, as reflected in the patient instructions. I was candid with him that > 83% of people >10 years of age have Alzheimer's disease, and a significant proportion of them have dementia, making that most likely in his case. There may be a more minor vascular contributor but I do not think the burden sufficient. I was also candid with him that his test data are really quite good considering the diagnosis. There are no concerns on my part about his driving safety. I took time to explain the findings and answer all the patient's questions. I encouraged Mr. Reither to contact me should he have any further questions or if further follow up is desired.   Current Medications and Medical History   Current Outpatient Medications  Medication Sig Dispense Refill  . amiodarone (PACERONE) 200 MG tablet Take 1 tablet (200 mg total) by mouth daily. Take 200 mg twice a day for 7 days than 200 mg daily 90 tablet 3  . ascorbic acid (VITAMIN C) 100 MG tablet Take 1 tablet by mouth daily.    Marland Kitchen aspirin EC 81 MG tablet Take 1 tablet (81 mg total) by mouth daily. Swallow whole. 30 tablet 11  . Cholecalciferol (VITAMIN D) 125 MCG (5000 UT) CAPS Take 1 tablet by mouth daily.    . Flaxseed, Linseed, (FLAX SEED OIL) 1000 MG CAPS Take 1 capsule by mouth See admin instructions.    . hydrochlorothiazide (HYDRODIURIL) 12.5 MG tablet Take 1 tablet by mouth daily.    Marland Kitchen HYDROcodone-acetaminophen (NORCO/VICODIN) 5-325 MG tablet Take 1 tablet by mouth daily.    . mirtazapine  (REMERON) 7.5 MG tablet Take 7.5 mg by mouth at bedtime.    . Multiple Vitamin (MULTI-VITAMIN) tablet Take 1 tablet by mouth daily.    . Omega-3 Fatty Acids (FISH OIL) 1200 MG CPDR Take 2 capsules by mouth daily.    . Zinc 100 MG TABS Take 1 tablet by mouth daily.     No current facility-administered medications for this visit.    Patient Active Problem List   Diagnosis Date Noted  . NSVT (nonsustained ventricular tachycardia) ([place]) 07/13/2020  . PAT (paroxysmal atrial tachycardia) ([place]) 07/13/2020  . Primary hypertension 07/13/2020  . Adjustment disorder with depressed mood 07/11/2020  . Grieving 07/11/2020  . Low back pain 07/11/2020  . Personal history of colonic polyps 07/11/2020  . Pure hypercholesterolemia   . Hypertension 06/17/2020  . Body mass index (BMI) 25.0-25.9, adult 06/17/2020  . Lumbar post-laminectomy syndrome 11/07/2018  . Supraventricular premature beats 07/09/2018  . Atherosclerosis of arteries 07/09/2018  . Benign prostatic hyperplasia without lower urinary tract symptoms 07/09/2018  . Diverticulosis of colon 07/09/2018  . Encounter for physical therapy 07/09/2018  . Inguinal hernia 07/09/2018  . Left bundle branch hemiblock 07/09/2018  . Surgical or procedure not carried out because of patient's decision 07/09/2018  . Symptoms referable to shoulder joint 07/09/2018  . Ureteric stone 07/09/2018  . Hydronephrosis 2014  . Obstructive sleep apnea (adult) (pediatric) 08/06/2011  . Lumbar radiculopathy 2006  . Hx of colonoscopy 2006  .  Thoracic spondylosis 2003    Mental Status and Behavioral Observations  SALVADOR COUPE presented on time to the present encounter and was alert and generally oriented. Speech was normal in rate, rhythm, volume, and prosody. Self-reported mood was "good" and affect was euthymic. Thought process was logical and goal-oriented and thought content was appropriate to the topics discussed. There were no safety concerns identified at  today's encounter, such as thoughts of harming self or others.   Plan  Feedback provided regarding the patient's neuropsychological evaluation. He and his wife presented as appreciative of feedback. We discussed diet and lifestyle changes that may be helpful. Not sure if he would be a candidate for aricept given his history of cardiac issues but he could follow up with his PCP if desired. ZAYYAN MULLEN was encouraged to contact me if any questions arise or if further follow up is desired.   Viviano Simas Nicole Kindred, PsyD, ABN Clinical Neuropsychologist  Service(s) Provided at This Encounter: 45 minutes [phone]; Conjoint therapy with patient present)

## 2020-08-08 DIAGNOSIS — E7800 Pure hypercholesterolemia, unspecified: Secondary | ICD-10-CM | POA: Insufficient documentation

## 2020-08-08 DIAGNOSIS — I493 Ventricular premature depolarization: Secondary | ICD-10-CM | POA: Insufficient documentation

## 2020-08-09 ENCOUNTER — Other Ambulatory Visit: Payer: Self-pay

## 2020-08-09 ENCOUNTER — Encounter: Payer: Self-pay | Admitting: Cardiology

## 2020-08-09 ENCOUNTER — Ambulatory Visit (INDEPENDENT_AMBULATORY_CARE_PROVIDER_SITE_OTHER): Payer: Medicare Other | Admitting: Cardiology

## 2020-08-09 VITALS — BP 128/72 | HR 71 | Ht 67.0 in | Wt 157.0 lb

## 2020-08-09 DIAGNOSIS — G4730 Sleep apnea, unspecified: Secondary | ICD-10-CM

## 2020-08-09 DIAGNOSIS — I471 Supraventricular tachycardia: Secondary | ICD-10-CM | POA: Diagnosis not present

## 2020-08-09 DIAGNOSIS — I493 Ventricular premature depolarization: Secondary | ICD-10-CM | POA: Diagnosis not present

## 2020-08-09 DIAGNOSIS — I10 Essential (primary) hypertension: Secondary | ICD-10-CM | POA: Diagnosis not present

## 2020-08-09 DIAGNOSIS — I472 Ventricular tachycardia: Secondary | ICD-10-CM | POA: Diagnosis not present

## 2020-08-09 DIAGNOSIS — I4729 Other ventricular tachycardia: Secondary | ICD-10-CM

## 2020-08-09 NOTE — Progress Notes (Signed)
Cardiology Office Note:    Date:  08/09/2020   ID:  Aaron Chang, DOB 06-18-1930, MRN [id number]  PCP:  Seward Carol, MD  Cardiologist:  Berniece Salines, DO  Electrophysiologist:  None   Referring MD: Seward Carol, MD   I feel a lot better  History of Present Illness:    Aaron Chang is a 87 y.o. male with a hx of history of PVC and nonsustained ventricular tachycardia on his ZIO monitor, sleep apnea follows with a sleep doctor is here today for follow-up visit.  Did see the patient on July 21, 2020 at that time he presented giving his abnormality on his ZIO monitor.  Started patient on amiodarone he is here today for follow-up visit.  The patient his wife tells me that his heart rate is going up but not as bad.  He feels a little better.  And is happy with the new medication.  Past Medical History:  Diagnosis Date  . Cervical radiculopathy 2003  . Hx of colonoscopy 2006   2011  . Hypercholesterolemia    side effects to lipitor and zocor  . Hypertension   . Kidney stone 2014  . Lumbar radiculopathy 2006  . PVC's (premature ventricular contractions)   . Severe sleep apnea 08/06/2011    Past Surgical History:  Procedure Laterality Date  . CATARACT EXTRACTION, BILATERAL  2017  . CERVICAL DISCECTOMY  2011  . ELBOW ARTHROPLASTY  2012  . FOOT SURGERY Right   . LUMBAR FUSION  2020  . ROTATOR CUFF REPAIR Right 1996  . ROTATOR CUFF REPAIR Left 2015  . TARSAL NAVICULAR ARTHODESIS  1940  . VASECTOMY  1968    Current Medications: Current Meds  Medication Sig  . amiodarone (PACERONE) 200 MG tablet Take 1 tablet (200 mg total) by mouth daily. Take 200 mg twice a day for 7 days than 200 mg daily  . ascorbic acid (VITAMIN C) 100 MG tablet Take 1 tablet by mouth daily.  Marland Kitchen aspirin EC 81 MG tablet Take 1 tablet (81 mg total) by mouth daily. Swallow whole.  . Cholecalciferol (VITAMIN D) 125 MCG (5000 UT) CAPS Take 1 tablet by mouth daily.  . Flaxseed, Linseed, (FLAX SEED OIL)  1000 MG CAPS Take 1 capsule by mouth See admin instructions.  . hydrochlorothiazide (HYDRODIURIL) 12.5 MG tablet Take 1 tablet by mouth daily.  Marland Kitchen HYDROcodone-acetaminophen (NORCO/VICODIN) 5-325 MG tablet Take 1 tablet by mouth daily.  . mirtazapine (REMERON) 7.5 MG tablet Take 7.5 mg by mouth at bedtime.  . Multiple Vitamin (MULTI-VITAMIN) tablet Take 1 tablet by mouth daily.  . Omega-3 Fatty Acids (FISH OIL) 1200 MG CPDR Take 2 capsules by mouth daily.  . Zinc 100 MG TABS Take 1 tablet by mouth daily.     Allergies:   Chocolate flavor, Flomax [tamsulosin], Statins, Atenolol, Atorvastatin, Chocolate, Codeine, Other, and Diazepam   Social History   Socioeconomic History  . Marital status: Married    Spouse name: Not on file  . Number of children: Not on file  . Years of education: Not on file  . Highest education level: Not on file  Occupational History  . Not on file  Tobacco Use  . Smoking status: Never Smoker  . Smokeless tobacco: Never Used  Vaping Use  . Vaping Use: Never used  Substance and Sexual Activity  . Alcohol use: Not Currently  . Drug use: Not on file  . Sexual activity: Not on file  Other Topics Concern  .  Not on file  Social History Narrative  . Not on file   Social Determinants of Health   Financial Resource Strain: Not on file  Food Insecurity: Not on file  Transportation Needs: Not on file  Physical Activity: Not on file  Stress: Not on file  Social Connections: Not on file     Family History: The patient's family history is not on file.  ROS:   Review of Systems  Constitution: Negative for decreased appetite, fever and weight gain.  HENT: Negative for congestion, ear discharge, hoarse voice and sore throat.   Eyes: Negative for discharge, redness, vision loss in right eye and visual halos.  Cardiovascular: Negative for chest pain, dyspnea on exertion, leg swelling, orthopnea and palpitations.  Respiratory: Negative for cough, hemoptysis,  shortness of breath and snoring.   Endocrine: Negative for heat intolerance and polyphagia.  Hematologic/Lymphatic: Negative for bleeding problem. Does not bruise/bleed easily.  Skin: Negative for flushing, nail changes, rash and suspicious lesions.  Musculoskeletal: Negative for arthritis, joint pain, muscle cramps, myalgias, neck pain and stiffness.  Gastrointestinal: Negative for abdominal pain, bowel incontinence, diarrhea and excessive appetite.  Genitourinary: Negative for decreased libido, genital sores and incomplete emptying.  Neurological: Negative for brief paralysis, focal weakness, headaches and loss of balance.  Psychiatric/Behavioral: Negative for altered mental status, depression and suicidal ideas.  Allergic/Immunologic: Negative for HIV exposure and persistent infections.    EKGs/Labs/Other Studies Reviewed:    The following studies were reviewed today:   EKG: None today  ZIO monitor which was placed on him by his primary care provider on June 21, 2020.  The patient wore the ZIO monitor for 6 days 18 hours.  His minimum heart rate was 46, maximum heart rate was 197, average heart rate was 66.  He had overall 227 episodes of nonsustained ventricular tachycardia with the fastest heartbeat rate at 197 bpm longest 4 beats.  He had a total of 1684 supraventricular tachycardia with the fastest heart rate at 184 bpm and the longest episode lasting 18.6 seconds.  There were frequent PACs 5.7%.  Frequent PVCs 12%.  No pauses, no AV block, no atrial fibrillation.  Recent Labs: 07/12/2020: ALT 20; BUN 26; Creatinine, Ser 0.97; Magnesium 2.4; Potassium 4.3; Sodium 140; TSH 2.250  Recent Lipid Panel No results found for: CHOL, TRIG, HDL, CHOLHDL, VLDL, LDLCALC, LDLDIRECT  Physical Exam:    VS:  BP 128/72   Pulse 71   Ht 5\' 7"  (1.702 m)   Wt 157 lb (71.2 kg)   SpO2 96%   BMI 24.59 kg/m     Wt Readings from Last 3 Encounters:  08/09/20 157 lb (71.2 kg)  07/12/20 160 lb 9.6  oz (72.8 kg)  06/02/20 167 lb (75.8 kg)     GEN: Well nourished, well developed in no acute distress HEENT: Normal NECK: No JVD; No carotid bruits LYMPHATICS: No lymphadenopathy CARDIAC: S1S2 noted,RRR, no murmurs, rubs, gallops RESPIRATORY:  Clear to auscultation without rales, wheezing or rhonchi  ABDOMEN: Soft, non-tender, non-distended, +bowel sounds, no guarding. EXTREMITIES: No edema, No cyanosis, no clubbing MUSCULOSKELETAL:  No deformity  SKIN: Warm and dry NEUROLOGIC:  Alert and oriented x 3, non-focal PSYCHIATRIC:  Normal affect, good insight  ASSESSMENT:    1. NSVT (nonsustained ventricular tachycardia) ([place])   2. PAT (paroxysmal atrial tachycardia) ([place])   3. Primary hypertension   4. PVC's (premature ventricular contractions)   5. Severe sleep apnea    PLAN:     He has had some  improvement on his amiodarone will continue this medication for now.  His blood pressure is acceptable no changes will be made.  Given the concern of the patient's wife stating that she fears that sometimes even though the medication has improved his heart rate has not completely settled and she is going to keep an eye on it I will see him back in 3 months, at that time if her record shows that the heart rate is not fully controlled we will start the patient on low-dose beta-blocker.  The patient is in agreement with the above plan. The patient left the office in stable condition.  The patient will follow up in 3 months.   Medication Adjustments/Labs and Tests Ordered: Current medicines are reviewed at length with the patient today.  Concerns regarding medicines are outlined above.  No orders of the defined types were placed in this encounter.  No orders of the defined types were placed in this encounter.   Patient Instructions  Medication Instructions:  No medication changes. *If you need a refill on your cardiac medications before your next appointment, please call your  pharmacy*   Lab Work: None ordered If you have labs (blood work) drawn today and your tests are completely normal, you will receive your results only by: Marland Kitchen MyChart Message (if you have MyChart) OR . A paper copy in the mail If you have any lab test that is abnormal or we need to change your treatment, we will call you to review the results.   Testing/Procedures: None ordered   Follow-Up: At [facility], you and your health needs are our priority.  As part of our continuing mission to provide you with exceptional heart care, we have created designated Provider Care Teams.  These Care Teams include your primary Cardiologist (physician) and Advanced Practice Providers (APPs -  Physician Assistants and Nurse Practitioners) who all work together to provide you with the care you need, when you need it.  We recommend signing up for the patient portal called "MyChart".  Sign up information is provided on this After Visit Summary.  MyChart is used to connect with patients for Virtual Visits (Telemedicine).  Patients are able to view lab/test results, encounter notes, upcoming appointments, etc.  Non-urgent messages can be sent to your provider as well.   To learn more about what you can do with MyChart, go to [email or web address].    Your next appointment:   3 month(s)  The format for your next appointment:   In Person  Provider:   Berniece Salines, DO   Other Instructions NA     Adopting a Healthy Lifestyle.  Know what a healthy weight is for you (roughly BMI <25) and aim to maintain this   Aim for 7+ servings of fruits and vegetables daily   65-80+ fluid ounces of water or unsweet tea for healthy kidneys   Limit to max 1 drink of alcohol per day; avoid smoking/tobacco   Limit animal fats in diet for cholesterol and heart health - choose grass fed whenever available   Avoid highly processed foods, and foods high in saturated/trans fats   Aim for low stress - take time to  unwind and care for your mental health   Aim for 150 min of moderate intensity exercise weekly for heart health, and weights twice weekly for bone health   Aim for 7-9 hours of sleep daily   When it comes to diets, agreement about the perfect plan isnt easy to find, even among  the experts. Experts at the [employer] developed an idea known as the Healthy Eating Plate. Just imagine a plate divided into logical, healthy portions.   The emphasis is on diet quality:   Load up on vegetables and fruits - one-half of your plate: Aim for color and variety, and remember that potatoes dont count.   Go for whole grains - one-quarter of your plate: Whole wheat, barley, wheat berries, quinoa, oats, brown rice, and foods made with them. If you want pasta, go with whole wheat pasta.   Protein power - one-quarter of your plate: Fish, chicken, beans, and nuts are all healthy, versatile protein sources. Limit red meat.   The diet, however, does go beyond the plate, offering a few other suggestions.   Use healthy plant oils, such as olive, canola, soy, corn, sunflower and peanut. Check the labels, and avoid partially hydrogenated oil, which have unhealthy trans fats.   If youre thirsty, drink water. Coffee and tea are good in moderation, but skip sugary drinks and limit milk and dairy products to one or two daily servings.   The type of carbohydrate in the diet is more important than the amount. Some sources of carbohydrates, such as vegetables, fruits, whole grains, and beans-are healthier than others.   Finally, stay active  Signed, Berniece Salines, DO  08/09/2020 11:49 AM    [place]

## 2020-08-09 NOTE — Patient Instructions (Signed)
Medication Instructions:  No medication changes. *If you need a refill on your cardiac medications before your next appointment, please call your pharmacy*   Lab Work: None ordered If you have labs (blood work) drawn today and your tests are completely normal, you will receive your results only by: . MyChart Message (if you have MyChart) OR . A paper copy in the mail If you have any lab test that is abnormal or we need to change your treatment, we will call you to review the results.   Testing/Procedures: None ordered   Follow-Up: At [facility], you and your health needs are our priority.  As part of our continuing mission to provide you with exceptional heart care, we have created designated Provider Care Teams.  These Care Teams include your primary Cardiologist (physician) and Advanced Practice Providers (APPs -  Physician Assistants and Nurse Practitioners) who all work together to provide you with the care you need, when you need it.  We recommend signing up for the patient portal called "MyChart".  Sign up information is provided on this After Visit Summary.  MyChart is used to connect with patients for Virtual Visits (Telemedicine).  Patients are able to view lab/test results, encounter notes, upcoming appointments, etc.  Non-urgent messages can be sent to your provider as well.   To learn more about what you can do with MyChart, go to [email or web address].    Your next appointment:   3 month(s)  The format for your next appointment:   In Person  Provider:   Kardie Tobb, DO   Other Instructions NA 

## 2020-08-10 DIAGNOSIS — R7309 Other abnormal glucose: Secondary | ICD-10-CM | POA: Diagnosis not present

## 2020-08-11 ENCOUNTER — Ambulatory Visit (INDEPENDENT_AMBULATORY_CARE_PROVIDER_SITE_OTHER): Payer: Medicare Other

## 2020-08-11 ENCOUNTER — Other Ambulatory Visit: Payer: Self-pay

## 2020-08-11 DIAGNOSIS — I471 Supraventricular tachycardia: Secondary | ICD-10-CM | POA: Diagnosis not present

## 2020-08-11 DIAGNOSIS — I472 Ventricular tachycardia: Secondary | ICD-10-CM | POA: Diagnosis not present

## 2020-08-11 DIAGNOSIS — I4729 Other ventricular tachycardia: Secondary | ICD-10-CM

## 2020-08-11 DIAGNOSIS — I4719 Other supraventricular tachycardia: Secondary | ICD-10-CM

## 2020-08-11 LAB — ECHOCARDIOGRAM COMPLETE
Area-P 1/2: 2.49 cm2
Calc EF: 43.1 %
S' Lateral: 3.5 cm
Single Plane A2C EF: 36.7 %
Single Plane A4C EF: 48.5 %

## 2020-08-18 ENCOUNTER — Other Ambulatory Visit: Payer: Self-pay | Admitting: Physician Assistant

## 2020-08-18 ENCOUNTER — Ambulatory Visit
Admission: RE | Admit: 2020-08-18 | Discharge: 2020-08-18 | Disposition: A | Discharge status: Home / Self Care | Payer: Medicare Other | Source: Ambulatory Visit | Attending: Physician Assistant | Admitting: Physician Assistant

## 2020-08-18 DIAGNOSIS — R042 Hemoptysis: Secondary | ICD-10-CM

## 2020-08-18 DIAGNOSIS — R059 Cough, unspecified: Secondary | ICD-10-CM | POA: Diagnosis not present

## 2020-08-23 ENCOUNTER — Other Ambulatory Visit (HOSPITAL_BASED_OUTPATIENT_CLINIC_OR_DEPARTMENT_OTHER): Payer: Self-pay

## 2020-08-23 DIAGNOSIS — G4731 Primary central sleep apnea: Secondary | ICD-10-CM

## 2020-08-23 DIAGNOSIS — G4733 Obstructive sleep apnea (adult) (pediatric): Secondary | ICD-10-CM

## 2020-09-08 DIAGNOSIS — M48061 Spinal stenosis, lumbar region without neurogenic claudication: Secondary | ICD-10-CM | POA: Diagnosis not present

## 2020-09-08 DIAGNOSIS — M961 Postlaminectomy syndrome, not elsewhere classified: Secondary | ICD-10-CM | POA: Diagnosis not present

## 2020-09-08 DIAGNOSIS — I10 Essential (primary) hypertension: Secondary | ICD-10-CM | POA: Diagnosis not present

## 2020-09-28 DIAGNOSIS — L568 Other specified acute skin changes due to ultraviolet radiation: Secondary | ICD-10-CM | POA: Diagnosis not present

## 2020-09-28 DIAGNOSIS — L570 Actinic keratosis: Secondary | ICD-10-CM | POA: Diagnosis not present

## 2020-10-10 ENCOUNTER — Other Ambulatory Visit: Payer: Self-pay

## 2020-10-10 ENCOUNTER — Ambulatory Visit (HOSPITAL_BASED_OUTPATIENT_CLINIC_OR_DEPARTMENT_OTHER): Payer: Medicare Other | Attending: Internal Medicine | Admitting: Internal Medicine

## 2020-10-10 DIAGNOSIS — G4731 Primary central sleep apnea: Secondary | ICD-10-CM | POA: Diagnosis not present

## 2020-10-10 DIAGNOSIS — G4733 Obstructive sleep apnea (adult) (pediatric): Secondary | ICD-10-CM | POA: Insufficient documentation

## 2020-10-11 DIAGNOSIS — G4731 Primary central sleep apnea: Secondary | ICD-10-CM | POA: Diagnosis not present

## 2020-10-20 ENCOUNTER — Other Ambulatory Visit (HOSPITAL_BASED_OUTPATIENT_CLINIC_OR_DEPARTMENT_OTHER): Payer: Self-pay

## 2020-10-20 DIAGNOSIS — G4731 Primary central sleep apnea: Secondary | ICD-10-CM

## 2020-10-20 DIAGNOSIS — G4733 Obstructive sleep apnea (adult) (pediatric): Secondary | ICD-10-CM

## 2020-10-30 NOTE — Procedures (Signed)
   NAME: Aaron Chang DATE OF BIRTH:  February 15, 1931 MEDICAL RECORD NUMBER [id number]  LOCATION: [facility]  PHYSICIAN: Marius Ditch  DATE OF STUDY: 10/10/2020  SLEEP STUDY TYPE: Positive Airway Pressure Titration               REFERRING PHYSICIAN: Marius Ditch, MD  EPWORTH SLEEPINESS SCORE:  9 HEIGHT: 5\' 7"  (170.2 cm)  WEIGHT: 155 lb (70.3 kg)    Body mass index is 24.28 kg/m.  NECK SIZE: 16.5 in.  CLINICAL INFORMATION The patient was referred to the sleep center PAP titration, including possible ASV titration, for central sleep apnea worsening over last several years.   MEDICATIONS Patient self administered medications include: REMERON. Medications administered during study include No sleep medicine administered.Marland Kitchen  SLEEP STUDY TECHNIQUE The patient underwent an attended overnight polysomnography titration to assess the effects of cpap therapy. The following variables were monitored: EEG (C4-A1, C3-A2, O1-A2, O2-A1, F3-M2, F4-M1), EOG, submental and leg EMG, ECG, oxyhemoglobin saturation by pulse oximetry, thoracic and abdominal respiratory effort belts, nasal/oral airflow by pressure sensor, body position sensor and snoring sensor. CPAP pressure was titrated to eliminate apneas, hypopneas and oxygen desaturation.  TECHNICAL COMMENTS Comments added by Technician: NO REST ROOM VISTED Comments added by Scorer: N/A  SLEEP ARCHITECTURE The study was initiated at 10:10:18 PM and terminated at 5:11:59 AM. Total recorded time was 421.7 minutes. EEG confirmed total sleep time was 276.5 minutes yielding a sleep efficiency of 65.6%%. Sleep onset after lights out was 17.7 minutes with a REM latency of N/A minutes. The patient spent 22.6%% of the night in stage N1 sleep, 76.3%% in stage N2 sleep, 1.1%% in stage N3 and 0% in REM. The Arousal Index was 69.2/hour.  RESPIRATORY PARAMETERS The overall AHI was 52.7 per hour, and the RDI was 62.9 events/hour with a central  apnea index of 13.5per hour. The most appropriate setting of BiPAP was IPAP/EPAP 26/22 cm H2O. At this setting, the sleep efficiency was 37 % and the patient was supine for 100%. At this setting, the AHI was 70.6 events per hour, and the RDI was 70.6 events/hour and the arousal index was 5.9 per hour. At this setting, the oxygen nadir was 93.0% during sleep.  LEG MOVEMENT DATA The total leg movements were 99 with a resulting leg movement index of 21.5. Associated arousal with leg movement index was 2.4.  CARDIAC DATA The underlying cardiac rhythm was most consistent with sinus rhythm. Mean heart rate during sleep was 55.2 bpm. Additional rhythm abnormalities include None.  IMPRESSIONS - Central sleep apnea - Obstructive sleep apnea - Inadequate PAP titration  DIAGNOSIS - Obstructive Sleep Apnea (G47.33) - Central Sleep Apnea  RECOMMENDATIONS - The patient will need an ASV titration.   Marius Ditch Sleep specialist, [place] Board of Internal Medicine  ELECTRONICALLY SIGNED ON:  10/30/2020, 2:30 PM [place] PH: [phone]   FX: [phone] [place]

## 2020-11-15 ENCOUNTER — Ambulatory Visit (INDEPENDENT_AMBULATORY_CARE_PROVIDER_SITE_OTHER): Payer: Medicare Other | Admitting: Cardiology

## 2020-11-15 ENCOUNTER — Other Ambulatory Visit: Payer: Self-pay

## 2020-11-15 ENCOUNTER — Encounter: Payer: Self-pay | Admitting: Cardiology

## 2020-11-15 VITALS — BP 130/82 | HR 61 | Ht 67.0 in | Wt 164.0 lb

## 2020-11-15 DIAGNOSIS — I493 Ventricular premature depolarization: Secondary | ICD-10-CM | POA: Diagnosis not present

## 2020-11-15 DIAGNOSIS — I472 Ventricular tachycardia: Secondary | ICD-10-CM

## 2020-11-15 DIAGNOSIS — I4719 Other supraventricular tachycardia: Secondary | ICD-10-CM

## 2020-11-15 DIAGNOSIS — G4733 Obstructive sleep apnea (adult) (pediatric): Secondary | ICD-10-CM | POA: Diagnosis not present

## 2020-11-15 DIAGNOSIS — I4729 Other ventricular tachycardia: Secondary | ICD-10-CM

## 2020-11-15 DIAGNOSIS — I471 Supraventricular tachycardia: Secondary | ICD-10-CM | POA: Diagnosis not present

## 2020-11-15 LAB — HEPATIC FUNCTION PANEL
ALT: 20 IU/L (ref 0–44)
AST: 20 IU/L (ref 0–40)
Albumin: 4.2 g/dL (ref 3.6–4.6)
Alkaline Phosphatase: 108 IU/L (ref 44–121)
Bilirubin Total: 0.8 mg/dL (ref 0.0–1.2)
Bilirubin, Direct: 0.16 mg/dL (ref 0.00–0.40)
Total Protein: 6.3 g/dL (ref 6.0–8.5)

## 2020-11-15 LAB — BASIC METABOLIC PANEL
BUN/Creatinine Ratio: 13 (ref 10–24)
BUN: 14 mg/dL (ref 8–27)
CO2: 23 mmol/L (ref 20–29)
Calcium: 9.4 mg/dL (ref 8.6–10.2)
Chloride: 100 mmol/L (ref 96–106)
Creatinine, Ser: 1.08 mg/dL (ref 0.76–1.27)
Glucose: 153 mg/dL — ABNORMAL HIGH (ref 65–99)
Potassium: 3.7 mmol/L (ref 3.5–5.2)
Sodium: 140 mmol/L (ref 134–144)
eGFR: 66 mL/min/{1.73_m2} (ref >59)

## 2020-11-15 LAB — MAGNESIUM: Magnesium: 2 mg/dL (ref 1.6–2.3)

## 2020-11-15 NOTE — Addendum Note (Signed)
Addended by: Orvan July on: 11/15/2020 09:34 AM   Modules accepted: Orders

## 2020-11-15 NOTE — Progress Notes (Signed)
Cardiology Office Note:    Date:  11/15/2020   ID:  BURECH MCFARLAND, DOB 1930-05-31, MRN [id number]  PCP:  Seward Carol, MD  Cardiologist:  Berniece Salines, DO  Electrophysiologist:  None   Referring MD: Seward Carol, MD   No chief complaint on file. I feel a lot better since starting this medication  History of Present Illness:    Aaron Chang is a 87 y.o. male with a hx of frequent PVCs, nonsustained ventricular tachycardia which was seen on the monitor, sleep apnea is here today for follow-up visit.  I last saw the patient on August 09, 2020 at that time they were concerned that his heart rate was not well controlled.  I encouraged him to keep him on the medication and monitor him.  He is here today with his wife and according to the patient's wife he has had good response.  He is not experiencing any symptoms at this time.  Past Medical History:  Diagnosis Date   Cervical radiculopathy 2003   Hx of colonoscopy 2006   2011   Hypercholesterolemia    side effects to lipitor and zocor   Hypertension    Kidney stone 2014   Lumbar radiculopathy 2006   PVC's (premature ventricular contractions)    Severe sleep apnea 08/06/2011    Past Surgical History:  Procedure Laterality Date   CATARACT EXTRACTION, BILATERAL  2017   CERVICAL DISCECTOMY  2011   ELBOW ARTHROPLASTY  2012   FOOT SURGERY Right    LUMBAR FUSION  2020   ROTATOR CUFF REPAIR Right 1996   ROTATOR CUFF REPAIR Left 2015   TARSAL NAVICULAR ARTHODESIS  1940   VASECTOMY  1968    Current Medications: Current Meds  Medication Sig   amiodarone (PACERONE) 200 MG tablet Take 1 tablet (200 mg total) by mouth daily. Take 200 mg twice a day for 7 days than 200 mg daily   ascorbic acid (VITAMIN C) 100 MG tablet Take 1 tablet by mouth daily.   aspirin EC 81 MG tablet Take 1 tablet (81 mg total) by mouth daily. Swallow whole.   Cholecalciferol (VITAMIN D) 125 MCG (5000 UT) CAPS Take 1 tablet by mouth daily.   Flaxseed,  Linseed, (FLAX SEED OIL) 1000 MG CAPS Take 1 capsule by mouth See admin instructions.   hydrochlorothiazide (HYDRODIURIL) 12.5 MG tablet Take 1 tablet by mouth daily.   HYDROcodone-acetaminophen (NORCO/VICODIN) 5-325 MG tablet Take 1 tablet by mouth daily.   mirtazapine (REMERON) 7.5 MG tablet Take 7.5 mg by mouth at bedtime.   Multiple Vitamin (MULTI-VITAMIN) tablet Take 1 tablet by mouth daily.   Omega-3 Fatty Acids (FISH OIL) 1200 MG CPDR Take 2 capsules by mouth daily.   Zinc 100 MG TABS Take 1 tablet by mouth daily.     Allergies:   Chocolate flavor, Flomax [tamsulosin], Statins, Atenolol, Atorvastatin, Chocolate, Codeine, Other, and Diazepam   Social History   Socioeconomic History   Marital status: Married    Spouse name: Not on file   Number of children: Not on file   Years of education: Not on file   Highest education level: Not on file  Occupational History   Not on file  Tobacco Use   Smoking status: Never   Smokeless tobacco: Never  Vaping Use   Vaping Use: Never used  Substance and Sexual Activity   Alcohol use: Not Currently   Drug use: Not on file   Sexual activity: Not on file  Other Topics  Concern   Not on file  Social History Narrative   Not on file   Social Determinants of Health   Financial Resource Strain: Not on file  Food Insecurity: Not on file  Transportation Needs: Not on file  Physical Activity: Not on file  Stress: Not on file  Social Connections: Not on file     Family History: The patient's family history is not on file.  ROS:   Review of Systems  Constitution: Negative for decreased appetite, fever and weight gain.  HENT: Negative for congestion, ear discharge, hoarse voice and sore throat.   Eyes: Negative for discharge, redness, vision loss in right eye and visual halos.  Cardiovascular: Negative for chest pain, dyspnea on exertion, leg swelling, orthopnea and palpitations.  Respiratory: Negative for cough, hemoptysis, shortness  of breath and snoring.   Endocrine: Negative for heat intolerance and polyphagia.  Hematologic/Lymphatic: Negative for bleeding problem. Does not bruise/bleed easily.  Skin: Negative for flushing, nail changes, rash and suspicious lesions.  Musculoskeletal: Negative for arthritis, joint pain, muscle cramps, myalgias, neck pain and stiffness.  Gastrointestinal: Negative for abdominal pain, bowel incontinence, diarrhea and excessive appetite.  Genitourinary: Negative for decreased libido, genital sores and incomplete emptying.  Neurological: Negative for brief paralysis, focal weakness, headaches and loss of balance.  Psychiatric/Behavioral: Negative for altered mental status, depression and suicidal ideas.  Allergic/Immunologic: Negative for HIV exposure and persistent infections.    EKGs/Labs/Other Studies Reviewed:    The following studies were reviewed today:   EKG: None today  ZIO monitor which was placed on him by his primary care provider on June 21, 2020.  The patient wore the ZIO monitor for 6 days 18 hours.  His minimum heart rate was 46, maximum heart rate was 197, average heart rate was 66.  He had overall 227 episodes of nonsustained ventricular tachycardia with the fastest heartbeat rate at 197 bpm longest 4 beats.  He had a total of 1684 supraventricular tachycardia with the fastest heart rate at 184 bpm and the longest episode lasting 18.6 seconds.  There were frequent PACs 5.7%.  Frequent PVCs 12%.  No pauses, no AV block, no atrial fibrillation.  Transthoracic echocardiogram August 11, 2020 IMPRESSIONS     1. Left ventricular ejection fraction, by estimation, is 50 to 55%. The  left ventricle has low normal function. The left ventricle has no regional  wall motion abnormalities. There is moderate concentric left ventricular  hypertrophy. Left ventricular  diastolic parameters are consistent with Grade I diastolic dysfunction  (impaired relaxation).   2. Right  ventricular systolic function is normal. The right ventricular  size is normal. There is mildly elevated pulmonary artery systolic  pressure.   3. The mitral valve is normal in structure. Mild mitral valve  regurgitation. No evidence of mitral stenosis.   4. The aortic valve is tricuspid. Aortic valve regurgitation is mild.  Mild aortic valve sclerosis is present, with no evidence of aortic valve  stenosis.   5. There is borderline dilatation of the ascending aorta, measuring 36  mm.   6. The inferior vena cava is normal in size with greater than 50%  respiratory variability, suggesting right atrial pressure of 3 mmHg.   FINDINGS   Left Ventricle: Left ventricular ejection fraction, by estimation, is 50  to 55%. The left ventricle has low normal function. The left ventricle has  no regional wall motion abnormalities. The left ventricular internal  cavity size was normal in size.  There is moderate concentric  left ventricular hypertrophy. Abnormal  (paradoxical) septal motion, consistent with left bundle branch block.  Left ventricular diastolic parameters are consistent with Grade I  diastolic dysfunction (impaired relaxation).  Indeterminate filling pressures.   Right Ventricle: The right ventricular size is normal. No increase in  right ventricular wall thickness. Right ventricular systolic function is  normal. There is mildly elevated pulmonary artery systolic pressure. The  tricuspid regurgitant velocity is 2.90   m/s, and with an assumed right atrial pressure of 3 mmHg, the estimated  right ventricular systolic pressure is 40.9 mmHg.   Left Atrium: Left atrial size was normal in size.   Right Atrium: Right atrial size was normal in size.   Pericardium: There is no evidence of pericardial effusion.   Mitral Valve: The mitral valve is normal in structure. Mild mitral valve  regurgitation. No evidence of mitral valve stenosis.   Tricuspid Valve: The tricuspid valve is normal  in structure. Tricuspid  valve regurgitation is mild . No evidence of tricuspid stenosis.   Aortic Valve: The aortic valve is tricuspid. Aortic valve regurgitation is  mild. Mild aortic valve sclerosis is present, with no evidence of aortic  valve stenosis.   Pulmonic Valve: The pulmonic valve was normal in structure. Pulmonic valve  regurgitation is not visualized. No evidence of pulmonic stenosis.   Aorta: The aortic root is normal in size and structure and the aortic arch  was not well visualized. There is borderline dilatation of the ascending  aorta, measuring 36 mm.   Venous: The pulmonary veins were not well visualized. The inferior vena  cava is normal in size with greater than 50% respiratory variability,  suggesting right atrial pressure of 3 mmHg.   IAS/Shunts: No atrial level shunt detected by color flow Doppler.   Recent Labs: 07/12/2020: ALT 20; BUN 26; Creatinine, Ser 0.97; Magnesium 2.4; Potassium 4.3; Sodium 140; TSH 2.250  Recent Lipid Panel No results found for: CHOL, TRIG, HDL, CHOLHDL, VLDL, LDLCALC, LDLDIRECT  Physical Exam:    VS:  BP 130/82   Pulse 61   Ht 5\' 7"  (1.702 m)   Wt 164 lb (74.4 kg)   SpO2 96%   BMI 25.69 kg/m     Wt Readings from Last 3 Encounters:  11/15/20 164 lb (74.4 kg)  10/10/20 155 lb (70.3 kg)  08/09/20 157 lb (71.2 kg)     GEN: Well nourished, well developed in no acute distress HEENT: Normal NECK: No JVD; No carotid bruits LYMPHATICS: No lymphadenopathy CARDIAC: S1S2 noted,RRR, no murmurs, rubs, gallops RESPIRATORY:  Clear to auscultation without rales, wheezing or rhonchi  ABDOMEN: Soft, non-tender, non-distended, +bowel sounds, no guarding. EXTREMITIES: No edema, No cyanosis, no clubbing MUSCULOSKELETAL:  No deformity  SKIN: Warm and dry NEUROLOGIC:  Alert and oriented x 3, non-focal PSYCHIATRIC:  Normal affect, good insight  ASSESSMENT:    1. NSVT (nonsustained ventricular tachycardia) ([place])   2. PAT (paroxysmal  atrial tachycardia) ([place])   3. Frequent PVCs   4. Obstructive sleep apnea (adult) (pediatric)    PLAN:     He has had good response with amiodarone.  No changes will be made.  He will stay on amiodarone 200 mg daily we will get blood work today for BMP, mag, LFTs.  Blood pressure is acceptable, continue with current antihypertensive regimen.   The patient is in agreement with the above plan. The patient left the office in stable condition.  The patient will follow up in 1 year or sooner if needed.  Medication Adjustments/Labs and Tests Ordered: Current medicines are reviewed at length with the patient today.  Concerns regarding medicines are outlined above.  No orders of the defined types were placed in this encounter.  No orders of the defined types were placed in this encounter.   There are no Patient Instructions on file for this visit.   Adopting a Healthy Lifestyle.  Know what a healthy weight is for you (roughly BMI <25) and aim to maintain this   Aim for 7+ servings of fruits and vegetables daily   65-80+ fluid ounces of water or unsweet tea for healthy kidneys   Limit to max 1 drink of alcohol per day; avoid smoking/tobacco   Limit animal fats in diet for cholesterol and heart health - choose grass fed whenever available   Avoid highly processed foods, and foods high in saturated/trans fats   Aim for low stress - take time to unwind and care for your mental health   Aim for 150 min of moderate intensity exercise weekly for heart health, and weights twice weekly for bone health   Aim for 7-9 hours of sleep daily   When it comes to diets, agreement about the perfect plan isnt easy to find, even among the experts. Experts at the [employer] developed an idea known as the Healthy Eating Plate. Just imagine a plate divided into logical, healthy portions.   The emphasis is on diet quality:   Load up on vegetables and fruits - one-half of your  plate: Aim for color and variety, and remember that potatoes dont count.   Go for whole grains - one-quarter of your plate: Whole wheat, barley, wheat berries, quinoa, oats, brown rice, and foods made with them. If you want pasta, go with whole wheat pasta.   Protein power - one-quarter of your plate: Fish, chicken, beans, and nuts are all healthy, versatile protein sources. Limit red meat.   The diet, however, does go beyond the plate, offering a few other suggestions.   Use healthy plant oils, such as olive, canola, soy, corn, sunflower and peanut. Check the labels, and avoid partially hydrogenated oil, which have unhealthy trans fats.   If youre thirsty, drink water. Coffee and tea are good in moderation, but skip sugary drinks and limit milk and dairy products to one or two daily servings.   The type of carbohydrate in the diet is more important than the amount. Some sources of carbohydrates, such as vegetables, fruits, whole grains, and beans-are healthier than others.   Finally, stay active  Signed, Berniece Salines, DO  11/15/2020 9:23 AM    [place]

## 2020-11-15 NOTE — Patient Instructions (Signed)
Medication Instructions:   Your physician recommends that you continue on your current medications as directed. Please refer to the Current Medication list given to you today.  *If you need a refill on your cardiac medications before your next appointment, please call your pharmacy*   Lab Work:  Your physician recommends that you return for lab work in:  TODAY: BMET, Mag LFTs  If you have labs (blood work) drawn today and your tests are completely normal, you will receive your results only by: [place] (if you have MyChart) OR A paper copy in the mail If you have any lab test that is abnormal or we need to change your treatment, we will call you to review the results.   Testing/Procedures: None   Follow-Up: At [facility], you and your health needs are our priority.  As part of our continuing mission to provide you with exceptional heart care, we have created designated Provider Care Teams.  These Care Teams include your primary Cardiologist (physician) and Advanced Practice Providers (APPs -  Physician Assistants and Nurse Practitioners) who all work together to provide you with the care you need, when you need it.  We recommend signing up for the patient portal called "MyChart".  Sign up information is provided on this After Visit Summary.  MyChart is used to connect with patients for Virtual Visits (Telemedicine).  Patients are able to view lab/test results, encounter notes, upcoming appointments, etc.  Non-urgent messages can be sent to your provider as well.   To learn more about what you can do with MyChart, go to [email or web address].    Your next appointment:   12 month(s)  The format for your next appointment:   In Person     Other Instructions

## 2020-12-05 ENCOUNTER — Other Ambulatory Visit: Payer: Self-pay

## 2020-12-05 ENCOUNTER — Ambulatory Visit (HOSPITAL_BASED_OUTPATIENT_CLINIC_OR_DEPARTMENT_OTHER): Payer: Medicare Other | Attending: Internal Medicine | Admitting: Internal Medicine

## 2020-12-05 DIAGNOSIS — G4731 Primary central sleep apnea: Secondary | ICD-10-CM

## 2020-12-05 DIAGNOSIS — G4733 Obstructive sleep apnea (adult) (pediatric): Secondary | ICD-10-CM | POA: Insufficient documentation

## 2020-12-06 DIAGNOSIS — G4733 Obstructive sleep apnea (adult) (pediatric): Secondary | ICD-10-CM | POA: Diagnosis not present

## 2020-12-08 DIAGNOSIS — M48061 Spinal stenosis, lumbar region without neurogenic claudication: Secondary | ICD-10-CM | POA: Diagnosis not present

## 2020-12-08 DIAGNOSIS — M961 Postlaminectomy syndrome, not elsewhere classified: Secondary | ICD-10-CM | POA: Diagnosis not present

## 2020-12-14 DIAGNOSIS — G4731 Primary central sleep apnea: Secondary | ICD-10-CM | POA: Diagnosis not present

## 2020-12-14 DIAGNOSIS — G4733 Obstructive sleep apnea (adult) (pediatric): Secondary | ICD-10-CM | POA: Diagnosis not present

## 2020-12-14 DIAGNOSIS — F515 Nightmare disorder: Secondary | ICD-10-CM | POA: Diagnosis not present

## 2020-12-19 NOTE — Procedures (Unsigned)
NAME: Aaron Chang DATE OF BIRTH:  June 29, 1930 MEDICAL RECORD NUMBER [id number]  LOCATION: [facility]  PHYSICIAN: Marius Ditch  DATE OF STUDY: 12/05/2020  SLEEP STUDY TYPE: Positive Airway Pressure Titration               REFERRING PHYSICIAN: Marius Ditch, MD  EPWORTH SLEEPINESS SCORE:  9 HEIGHT: '5\' 7"'$  (170.2 cm)  WEIGHT: 155 lb (70.3 kg)    Body mass index is 24.28 kg/m.  NECK SIZE: 16.5 in.  CLINICAL INFORMATION The patient was referred to the sleep center for evaluation of Central Sleep Apnea that developed and worsened as Obstructive Sleep Apnea was being treated wiht auto-adjusting CPAP. His most recent CPAP downloads show AHI in the 40s, mainly centrals. An overnight oximetry in 2019 with persistent CSA showed normal oxygenation.  Most recent BPAP titration study dated 10/10/2020 revealed an AHI of 70.6/h and was unsuccessful. The patient takes minimal opiates. He has no signs and symptoms of CHF.  MEDICATIONS Patient self administered medications include: REMERON. No sleep medicine administered.Marland Kitchen  SLEEP STUDY TECHNIQUE The patient underwent an attended overnight polysomnography titration to assess the effects of cpap therapy. The following variables were monitored: EEG (C4-A1, C3-A2, O1-A2, O2-A1, F3-M2, F4-M1), EOG, submental and leg EMG, ECG, oxyhemoglobin saturation by pulse oximetry, thoracic and abdominal respiratory effort belts, nasal/oral airflow by pressure sensor, body position sensor and snoring sensor. CPAP pressure was titrated to eliminate apneas, hypopneas and oxygen desaturation.  TECHNICAL COMMENTS Comments added by Technician: PT HAD ONE RESTROOM VISTED. Patient had difficulty initiating sleep. Patient was restless all through the night. Comments added by Scorer: N/A  SLEEP ARCHITECTURE The study was initiated at 10:19:00 PM and terminated at 4:53:05 AM. The total recorded time was 394.1 minutes. EEG confirmed total sleep time  was 260.5 minutes yielding a sleep efficiency of 66.1%%. Sleep onset after lights out was 9.5 minutes with a REM latency of 164.0 minutes. The patient spent 20.5%% of the night in stage N1 sleep, 78.5%% in stage N2 sleep, 0.0%% in stage N3 and 1% in REM. Wake after sleep onset (WASO) was 124.1 minutes. The Arousal Index was 64.5/hour.  RESPIRATORY PARAMETERS The final setting of ASV was EPAP Min 10 and Max 15 cmH2O, Pressure Support Min 5 and Max 15 cmH2O with Max Pressure of 25 cmH2O and Breath Rate of Auto BrPM. This did not relieve all of his sleep disordered breathing. It was difficult to tell if his residual hypopneas were obstructive or central.  LEG MOVEMENT DATA The periodic limb movement index was 0.0/hour with an associated arousal index of /hour.  CARDIAC DATA The underlying cardiac rhythm was most consistent with sinus rhythm. Mean heart rate during sleep was 50.7 bpm. Additional rhythm abnormalities include None.  IMPRESSIONS - Severe Obstructive Sleep apnea (OSA) - Severe Central Sleep apnea - Optimal settings not found  DIAGNOSIS - Obstructive Sleep Apnea (G47.33) - Central Sleep Apnea  RECOMMENDATIONS - It is difficult to know what to recommend in this case. ASV did not significantly improve his sleep disordered breathing compared to APAP at home. ASV should have solved any centrals and residual events should be obstructives although their shape was not typical of obstructives. If they are obstructives, BPAP with pressures of 26/21 did not solve his sleep disordered breathing.  - Choices include keeping him on auto adjusting CPAP or consider move to [place] [facility] specialist, [employer]  ELECTRONICALLY SIGNED ON:  12/19/2020, 8:18 PM [place] PH: [phone]   FX: [phone] [place]

## 2021-01-06 DIAGNOSIS — E041 Nontoxic single thyroid nodule: Secondary | ICD-10-CM | POA: Diagnosis not present

## 2021-01-06 DIAGNOSIS — S01511A Laceration without foreign body of lip, initial encounter: Secondary | ICD-10-CM | POA: Diagnosis not present

## 2021-01-06 DIAGNOSIS — M4319 Spondylolisthesis, multiple sites in spine: Secondary | ICD-10-CM | POA: Diagnosis not present

## 2021-01-06 DIAGNOSIS — Z79899 Other long term (current) drug therapy: Secondary | ICD-10-CM | POA: Diagnosis not present

## 2021-01-06 DIAGNOSIS — S0003XA Contusion of scalp, initial encounter: Secondary | ICD-10-CM | POA: Diagnosis not present

## 2021-01-06 DIAGNOSIS — I10 Essential (primary) hypertension: Secondary | ICD-10-CM | POA: Diagnosis not present

## 2021-01-06 DIAGNOSIS — S199XXA Unspecified injury of neck, initial encounter: Secondary | ICD-10-CM | POA: Diagnosis not present

## 2021-01-06 DIAGNOSIS — Z7982 Long term (current) use of aspirin: Secondary | ICD-10-CM | POA: Diagnosis not present

## 2021-01-06 DIAGNOSIS — S61412A Laceration without foreign body of left hand, initial encounter: Secondary | ICD-10-CM | POA: Diagnosis not present

## 2021-01-06 DIAGNOSIS — R0902 Hypoxemia: Secondary | ICD-10-CM | POA: Diagnosis not present

## 2021-01-06 DIAGNOSIS — S60511A Abrasion of right hand, initial encounter: Secondary | ICD-10-CM | POA: Diagnosis not present

## 2021-01-06 DIAGNOSIS — S078XXA Crushing injury of other parts of head, initial encounter: Secondary | ICD-10-CM | POA: Diagnosis not present

## 2021-01-06 DIAGNOSIS — S60512A Abrasion of left hand, initial encounter: Secondary | ICD-10-CM | POA: Diagnosis not present

## 2021-01-06 DIAGNOSIS — R260 Ataxic gait: Secondary | ICD-10-CM | POA: Diagnosis not present

## 2021-01-12 DIAGNOSIS — S0003XA Contusion of scalp, initial encounter: Secondary | ICD-10-CM | POA: Diagnosis not present

## 2021-01-12 DIAGNOSIS — E041 Nontoxic single thyroid nodule: Secondary | ICD-10-CM | POA: Diagnosis not present

## 2021-01-12 DIAGNOSIS — W19XXXD Unspecified fall, subsequent encounter: Secondary | ICD-10-CM | POA: Diagnosis not present

## 2021-01-13 DIAGNOSIS — E042 Nontoxic multinodular goiter: Secondary | ICD-10-CM | POA: Diagnosis not present

## 2021-01-13 DIAGNOSIS — E041 Nontoxic single thyroid nodule: Secondary | ICD-10-CM | POA: Diagnosis not present

## 2021-01-26 DIAGNOSIS — M6281 Muscle weakness (generalized): Secondary | ICD-10-CM | POA: Diagnosis not present

## 2021-01-26 DIAGNOSIS — R296 Repeated falls: Secondary | ICD-10-CM | POA: Diagnosis not present

## 2021-01-26 DIAGNOSIS — M545 Low back pain, unspecified: Secondary | ICD-10-CM | POA: Diagnosis not present

## 2021-01-26 DIAGNOSIS — R2681 Unsteadiness on feet: Secondary | ICD-10-CM | POA: Diagnosis not present

## 2021-01-30 DIAGNOSIS — R296 Repeated falls: Secondary | ICD-10-CM | POA: Diagnosis not present

## 2021-01-30 DIAGNOSIS — R2681 Unsteadiness on feet: Secondary | ICD-10-CM | POA: Diagnosis not present

## 2021-01-30 DIAGNOSIS — M6281 Muscle weakness (generalized): Secondary | ICD-10-CM | POA: Diagnosis not present

## 2021-01-30 DIAGNOSIS — M545 Low back pain, unspecified: Secondary | ICD-10-CM | POA: Diagnosis not present

## 2021-02-02 DIAGNOSIS — M545 Low back pain, unspecified: Secondary | ICD-10-CM | POA: Diagnosis not present

## 2021-02-02 DIAGNOSIS — R296 Repeated falls: Secondary | ICD-10-CM | POA: Diagnosis not present

## 2021-02-02 DIAGNOSIS — M6281 Muscle weakness (generalized): Secondary | ICD-10-CM | POA: Diagnosis not present

## 2021-02-02 DIAGNOSIS — R2681 Unsteadiness on feet: Secondary | ICD-10-CM | POA: Diagnosis not present

## 2021-02-06 DIAGNOSIS — M6281 Muscle weakness (generalized): Secondary | ICD-10-CM | POA: Diagnosis not present

## 2021-02-06 DIAGNOSIS — M545 Low back pain, unspecified: Secondary | ICD-10-CM | POA: Diagnosis not present

## 2021-02-06 DIAGNOSIS — R2681 Unsteadiness on feet: Secondary | ICD-10-CM | POA: Diagnosis not present

## 2021-02-06 DIAGNOSIS — R296 Repeated falls: Secondary | ICD-10-CM | POA: Diagnosis not present

## 2021-02-09 DIAGNOSIS — M545 Low back pain, unspecified: Secondary | ICD-10-CM | POA: Diagnosis not present

## 2021-02-09 DIAGNOSIS — R2681 Unsteadiness on feet: Secondary | ICD-10-CM | POA: Diagnosis not present

## 2021-02-09 DIAGNOSIS — M6281 Muscle weakness (generalized): Secondary | ICD-10-CM | POA: Diagnosis not present

## 2021-02-09 DIAGNOSIS — R296 Repeated falls: Secondary | ICD-10-CM | POA: Diagnosis not present

## 2021-02-13 DIAGNOSIS — M6281 Muscle weakness (generalized): Secondary | ICD-10-CM | POA: Diagnosis not present

## 2021-02-13 DIAGNOSIS — R296 Repeated falls: Secondary | ICD-10-CM | POA: Diagnosis not present

## 2021-02-13 DIAGNOSIS — M545 Low back pain, unspecified: Secondary | ICD-10-CM | POA: Diagnosis not present

## 2021-02-13 DIAGNOSIS — R2681 Unsteadiness on feet: Secondary | ICD-10-CM | POA: Diagnosis not present

## 2021-02-15 DIAGNOSIS — M545 Low back pain, unspecified: Secondary | ICD-10-CM | POA: Diagnosis not present

## 2021-02-15 DIAGNOSIS — R296 Repeated falls: Secondary | ICD-10-CM | POA: Diagnosis not present

## 2021-02-15 DIAGNOSIS — M6281 Muscle weakness (generalized): Secondary | ICD-10-CM | POA: Diagnosis not present

## 2021-02-15 DIAGNOSIS — R2681 Unsteadiness on feet: Secondary | ICD-10-CM | POA: Diagnosis not present

## 2021-02-20 DIAGNOSIS — M6281 Muscle weakness (generalized): Secondary | ICD-10-CM | POA: Diagnosis not present

## 2021-02-20 DIAGNOSIS — R2681 Unsteadiness on feet: Secondary | ICD-10-CM | POA: Diagnosis not present

## 2021-02-20 DIAGNOSIS — R296 Repeated falls: Secondary | ICD-10-CM | POA: Diagnosis not present

## 2021-02-20 DIAGNOSIS — M545 Low back pain, unspecified: Secondary | ICD-10-CM | POA: Diagnosis not present

## 2021-02-22 DIAGNOSIS — R296 Repeated falls: Secondary | ICD-10-CM | POA: Diagnosis not present

## 2021-02-22 DIAGNOSIS — R2681 Unsteadiness on feet: Secondary | ICD-10-CM | POA: Diagnosis not present

## 2021-02-22 DIAGNOSIS — M6281 Muscle weakness (generalized): Secondary | ICD-10-CM | POA: Diagnosis not present

## 2021-02-22 DIAGNOSIS — M545 Low back pain, unspecified: Secondary | ICD-10-CM | POA: Diagnosis not present

## 2021-02-28 DIAGNOSIS — R2681 Unsteadiness on feet: Secondary | ICD-10-CM | POA: Diagnosis not present

## 2021-02-28 DIAGNOSIS — M6281 Muscle weakness (generalized): Secondary | ICD-10-CM | POA: Diagnosis not present

## 2021-02-28 DIAGNOSIS — M545 Low back pain, unspecified: Secondary | ICD-10-CM | POA: Diagnosis not present

## 2021-02-28 DIAGNOSIS — R296 Repeated falls: Secondary | ICD-10-CM | POA: Diagnosis not present

## 2021-03-02 DIAGNOSIS — M545 Low back pain, unspecified: Secondary | ICD-10-CM | POA: Diagnosis not present

## 2021-03-02 DIAGNOSIS — R2681 Unsteadiness on feet: Secondary | ICD-10-CM | POA: Diagnosis not present

## 2021-03-02 DIAGNOSIS — R296 Repeated falls: Secondary | ICD-10-CM | POA: Diagnosis not present

## 2021-03-02 DIAGNOSIS — M6281 Muscle weakness (generalized): Secondary | ICD-10-CM | POA: Diagnosis not present

## 2021-03-06 DIAGNOSIS — R2681 Unsteadiness on feet: Secondary | ICD-10-CM | POA: Diagnosis not present

## 2021-03-06 DIAGNOSIS — R296 Repeated falls: Secondary | ICD-10-CM | POA: Diagnosis not present

## 2021-03-06 DIAGNOSIS — M545 Low back pain, unspecified: Secondary | ICD-10-CM | POA: Diagnosis not present

## 2021-03-06 DIAGNOSIS — M6281 Muscle weakness (generalized): Secondary | ICD-10-CM | POA: Diagnosis not present

## 2021-03-08 DIAGNOSIS — R296 Repeated falls: Secondary | ICD-10-CM | POA: Diagnosis not present

## 2021-03-08 DIAGNOSIS — M545 Low back pain, unspecified: Secondary | ICD-10-CM | POA: Diagnosis not present

## 2021-03-08 DIAGNOSIS — M6281 Muscle weakness (generalized): Secondary | ICD-10-CM | POA: Diagnosis not present

## 2021-03-08 DIAGNOSIS — R2681 Unsteadiness on feet: Secondary | ICD-10-CM | POA: Diagnosis not present

## 2021-03-09 DIAGNOSIS — M961 Postlaminectomy syndrome, not elsewhere classified: Secondary | ICD-10-CM | POA: Diagnosis not present

## 2021-03-09 DIAGNOSIS — M48061 Spinal stenosis, lumbar region without neurogenic claudication: Secondary | ICD-10-CM | POA: Diagnosis not present

## 2021-03-09 DIAGNOSIS — I10 Essential (primary) hypertension: Secondary | ICD-10-CM | POA: Diagnosis not present

## 2021-03-15 DIAGNOSIS — M6281 Muscle weakness (generalized): Secondary | ICD-10-CM | POA: Diagnosis not present

## 2021-03-15 DIAGNOSIS — R2681 Unsteadiness on feet: Secondary | ICD-10-CM | POA: Diagnosis not present

## 2021-03-15 DIAGNOSIS — R296 Repeated falls: Secondary | ICD-10-CM | POA: Diagnosis not present

## 2021-03-15 DIAGNOSIS — M545 Low back pain, unspecified: Secondary | ICD-10-CM | POA: Diagnosis not present

## 2021-03-21 DIAGNOSIS — M545 Low back pain, unspecified: Secondary | ICD-10-CM | POA: Diagnosis not present

## 2021-03-21 DIAGNOSIS — R296 Repeated falls: Secondary | ICD-10-CM | POA: Diagnosis not present

## 2021-03-21 DIAGNOSIS — R2681 Unsteadiness on feet: Secondary | ICD-10-CM | POA: Diagnosis not present

## 2021-03-21 DIAGNOSIS — M6281 Muscle weakness (generalized): Secondary | ICD-10-CM | POA: Diagnosis not present

## 2021-03-23 DIAGNOSIS — M545 Low back pain, unspecified: Secondary | ICD-10-CM | POA: Diagnosis not present

## 2021-03-23 DIAGNOSIS — R296 Repeated falls: Secondary | ICD-10-CM | POA: Diagnosis not present

## 2021-03-23 DIAGNOSIS — R2681 Unsteadiness on feet: Secondary | ICD-10-CM | POA: Diagnosis not present

## 2021-03-23 DIAGNOSIS — M6281 Muscle weakness (generalized): Secondary | ICD-10-CM | POA: Diagnosis not present

## 2021-03-28 DIAGNOSIS — R2681 Unsteadiness on feet: Secondary | ICD-10-CM | POA: Diagnosis not present

## 2021-03-28 DIAGNOSIS — R296 Repeated falls: Secondary | ICD-10-CM | POA: Diagnosis not present

## 2021-03-28 DIAGNOSIS — M6281 Muscle weakness (generalized): Secondary | ICD-10-CM | POA: Diagnosis not present

## 2021-03-28 DIAGNOSIS — M545 Low back pain, unspecified: Secondary | ICD-10-CM | POA: Diagnosis not present

## 2021-04-06 DIAGNOSIS — M545 Low back pain, unspecified: Secondary | ICD-10-CM | POA: Diagnosis not present

## 2021-04-06 DIAGNOSIS — R296 Repeated falls: Secondary | ICD-10-CM | POA: Diagnosis not present

## 2021-04-06 DIAGNOSIS — M6281 Muscle weakness (generalized): Secondary | ICD-10-CM | POA: Diagnosis not present

## 2021-04-06 DIAGNOSIS — R2681 Unsteadiness on feet: Secondary | ICD-10-CM | POA: Diagnosis not present

## 2021-04-10 DIAGNOSIS — M6281 Muscle weakness (generalized): Secondary | ICD-10-CM | POA: Diagnosis not present

## 2021-04-10 DIAGNOSIS — R296 Repeated falls: Secondary | ICD-10-CM | POA: Diagnosis not present

## 2021-04-10 DIAGNOSIS — R2681 Unsteadiness on feet: Secondary | ICD-10-CM | POA: Diagnosis not present

## 2021-04-10 DIAGNOSIS — M545 Low back pain, unspecified: Secondary | ICD-10-CM | POA: Diagnosis not present

## 2021-04-11 DIAGNOSIS — G4733 Obstructive sleep apnea (adult) (pediatric): Secondary | ICD-10-CM | POA: Diagnosis not present

## 2021-04-17 DIAGNOSIS — M545 Low back pain, unspecified: Secondary | ICD-10-CM | POA: Diagnosis not present

## 2021-04-17 DIAGNOSIS — M6281 Muscle weakness (generalized): Secondary | ICD-10-CM | POA: Diagnosis not present

## 2021-04-17 DIAGNOSIS — R296 Repeated falls: Secondary | ICD-10-CM | POA: Diagnosis not present

## 2021-04-17 DIAGNOSIS — R2681 Unsteadiness on feet: Secondary | ICD-10-CM | POA: Diagnosis not present

## 2021-04-20 DIAGNOSIS — R296 Repeated falls: Secondary | ICD-10-CM | POA: Diagnosis not present

## 2021-04-20 DIAGNOSIS — M6281 Muscle weakness (generalized): Secondary | ICD-10-CM | POA: Diagnosis not present

## 2021-04-20 DIAGNOSIS — M545 Low back pain, unspecified: Secondary | ICD-10-CM | POA: Diagnosis not present

## 2021-04-20 DIAGNOSIS — R2681 Unsteadiness on feet: Secondary | ICD-10-CM | POA: Diagnosis not present

## 2021-04-25 DIAGNOSIS — M545 Low back pain, unspecified: Secondary | ICD-10-CM | POA: Diagnosis not present

## 2021-04-25 DIAGNOSIS — R296 Repeated falls: Secondary | ICD-10-CM | POA: Diagnosis not present

## 2021-04-25 DIAGNOSIS — R2681 Unsteadiness on feet: Secondary | ICD-10-CM | POA: Diagnosis not present

## 2021-04-25 DIAGNOSIS — M6281 Muscle weakness (generalized): Secondary | ICD-10-CM | POA: Diagnosis not present

## 2021-04-26 DIAGNOSIS — D485 Neoplasm of uncertain behavior of skin: Secondary | ICD-10-CM | POA: Diagnosis not present

## 2021-04-26 DIAGNOSIS — D239 Other benign neoplasm of skin, unspecified: Secondary | ICD-10-CM | POA: Diagnosis not present

## 2021-04-26 DIAGNOSIS — C44212 Basal cell carcinoma of skin of right ear and external auricular canal: Secondary | ICD-10-CM | POA: Diagnosis not present

## 2021-04-27 DIAGNOSIS — R2681 Unsteadiness on feet: Secondary | ICD-10-CM | POA: Diagnosis not present

## 2021-04-27 DIAGNOSIS — M545 Low back pain, unspecified: Secondary | ICD-10-CM | POA: Diagnosis not present

## 2021-04-27 DIAGNOSIS — R296 Repeated falls: Secondary | ICD-10-CM | POA: Diagnosis not present

## 2021-04-27 DIAGNOSIS — M6281 Muscle weakness (generalized): Secondary | ICD-10-CM | POA: Diagnosis not present

## 2021-05-01 DIAGNOSIS — M545 Low back pain, unspecified: Secondary | ICD-10-CM | POA: Diagnosis not present

## 2021-05-01 DIAGNOSIS — R296 Repeated falls: Secondary | ICD-10-CM | POA: Diagnosis not present

## 2021-05-01 DIAGNOSIS — M6281 Muscle weakness (generalized): Secondary | ICD-10-CM | POA: Diagnosis not present

## 2021-05-01 DIAGNOSIS — R2681 Unsteadiness on feet: Secondary | ICD-10-CM | POA: Diagnosis not present

## 2021-05-04 DIAGNOSIS — R296 Repeated falls: Secondary | ICD-10-CM | POA: Diagnosis not present

## 2021-05-04 DIAGNOSIS — M545 Low back pain, unspecified: Secondary | ICD-10-CM | POA: Diagnosis not present

## 2021-05-04 DIAGNOSIS — M6281 Muscle weakness (generalized): Secondary | ICD-10-CM | POA: Diagnosis not present

## 2021-05-04 DIAGNOSIS — R2681 Unsteadiness on feet: Secondary | ICD-10-CM | POA: Diagnosis not present

## 2021-05-08 DIAGNOSIS — M545 Low back pain, unspecified: Secondary | ICD-10-CM | POA: Diagnosis not present

## 2021-05-08 DIAGNOSIS — F4321 Adjustment disorder with depressed mood: Secondary | ICD-10-CM | POA: Diagnosis not present

## 2021-05-08 DIAGNOSIS — R296 Repeated falls: Secondary | ICD-10-CM | POA: Diagnosis not present

## 2021-05-08 DIAGNOSIS — R2681 Unsteadiness on feet: Secondary | ICD-10-CM | POA: Diagnosis not present

## 2021-05-08 DIAGNOSIS — M6281 Muscle weakness (generalized): Secondary | ICD-10-CM | POA: Diagnosis not present

## 2021-05-08 DIAGNOSIS — I10 Essential (primary) hypertension: Secondary | ICD-10-CM | POA: Diagnosis not present

## 2021-05-08 DIAGNOSIS — E7800 Pure hypercholesterolemia, unspecified: Secondary | ICD-10-CM | POA: Diagnosis not present

## 2021-05-11 DIAGNOSIS — M545 Low back pain, unspecified: Secondary | ICD-10-CM | POA: Diagnosis not present

## 2021-05-11 DIAGNOSIS — R2681 Unsteadiness on feet: Secondary | ICD-10-CM | POA: Diagnosis not present

## 2021-05-11 DIAGNOSIS — R296 Repeated falls: Secondary | ICD-10-CM | POA: Diagnosis not present

## 2021-05-11 DIAGNOSIS — M6281 Muscle weakness (generalized): Secondary | ICD-10-CM | POA: Diagnosis not present

## 2021-05-15 DIAGNOSIS — M2578 Osteophyte, vertebrae: Secondary | ICD-10-CM | POA: Diagnosis not present

## 2021-05-15 DIAGNOSIS — S0990XA Unspecified injury of head, initial encounter: Secondary | ICD-10-CM | POA: Diagnosis not present

## 2021-05-15 DIAGNOSIS — S0003XA Contusion of scalp, initial encounter: Secondary | ICD-10-CM | POA: Diagnosis not present

## 2021-05-18 DIAGNOSIS — M545 Low back pain, unspecified: Secondary | ICD-10-CM | POA: Diagnosis not present

## 2021-05-18 DIAGNOSIS — R296 Repeated falls: Secondary | ICD-10-CM | POA: Diagnosis not present

## 2021-05-18 DIAGNOSIS — R2681 Unsteadiness on feet: Secondary | ICD-10-CM | POA: Diagnosis not present

## 2021-05-18 DIAGNOSIS — M6281 Muscle weakness (generalized): Secondary | ICD-10-CM | POA: Diagnosis not present

## 2021-05-24 DIAGNOSIS — R296 Repeated falls: Secondary | ICD-10-CM | POA: Diagnosis not present

## 2021-05-24 DIAGNOSIS — M6281 Muscle weakness (generalized): Secondary | ICD-10-CM | POA: Diagnosis not present

## 2021-05-24 DIAGNOSIS — M545 Low back pain, unspecified: Secondary | ICD-10-CM | POA: Diagnosis not present

## 2021-05-24 DIAGNOSIS — R2681 Unsteadiness on feet: Secondary | ICD-10-CM | POA: Diagnosis not present

## 2021-05-26 DIAGNOSIS — R2681 Unsteadiness on feet: Secondary | ICD-10-CM | POA: Diagnosis not present

## 2021-05-26 DIAGNOSIS — M545 Low back pain, unspecified: Secondary | ICD-10-CM | POA: Diagnosis not present

## 2021-05-26 DIAGNOSIS — M6281 Muscle weakness (generalized): Secondary | ICD-10-CM | POA: Diagnosis not present

## 2021-05-26 DIAGNOSIS — R296 Repeated falls: Secondary | ICD-10-CM | POA: Diagnosis not present

## 2021-05-30 DIAGNOSIS — M6281 Muscle weakness (generalized): Secondary | ICD-10-CM | POA: Diagnosis not present

## 2021-05-30 DIAGNOSIS — M545 Low back pain, unspecified: Secondary | ICD-10-CM | POA: Diagnosis not present

## 2021-05-30 DIAGNOSIS — R2681 Unsteadiness on feet: Secondary | ICD-10-CM | POA: Diagnosis not present

## 2021-05-30 DIAGNOSIS — R296 Repeated falls: Secondary | ICD-10-CM | POA: Diagnosis not present

## 2021-06-01 DIAGNOSIS — R2681 Unsteadiness on feet: Secondary | ICD-10-CM | POA: Diagnosis not present

## 2021-06-01 DIAGNOSIS — M545 Low back pain, unspecified: Secondary | ICD-10-CM | POA: Diagnosis not present

## 2021-06-01 DIAGNOSIS — M6281 Muscle weakness (generalized): Secondary | ICD-10-CM | POA: Diagnosis not present

## 2021-06-01 DIAGNOSIS — R296 Repeated falls: Secondary | ICD-10-CM | POA: Diagnosis not present

## 2021-06-06 DIAGNOSIS — M545 Low back pain, unspecified: Secondary | ICD-10-CM | POA: Diagnosis not present

## 2021-06-06 DIAGNOSIS — R2681 Unsteadiness on feet: Secondary | ICD-10-CM | POA: Diagnosis not present

## 2021-06-06 DIAGNOSIS — M6281 Muscle weakness (generalized): Secondary | ICD-10-CM | POA: Diagnosis not present

## 2021-06-06 DIAGNOSIS — R296 Repeated falls: Secondary | ICD-10-CM | POA: Diagnosis not present

## 2021-06-08 DIAGNOSIS — M6281 Muscle weakness (generalized): Secondary | ICD-10-CM | POA: Diagnosis not present

## 2021-06-08 DIAGNOSIS — M961 Postlaminectomy syndrome, not elsewhere classified: Secondary | ICD-10-CM | POA: Diagnosis not present

## 2021-06-08 DIAGNOSIS — M545 Low back pain, unspecified: Secondary | ICD-10-CM | POA: Diagnosis not present

## 2021-06-08 DIAGNOSIS — R296 Repeated falls: Secondary | ICD-10-CM | POA: Diagnosis not present

## 2021-06-08 DIAGNOSIS — M48061 Spinal stenosis, lumbar region without neurogenic claudication: Secondary | ICD-10-CM | POA: Diagnosis not present

## 2021-06-08 DIAGNOSIS — Z5181 Encounter for therapeutic drug level monitoring: Secondary | ICD-10-CM | POA: Diagnosis not present

## 2021-06-08 DIAGNOSIS — R2681 Unsteadiness on feet: Secondary | ICD-10-CM | POA: Diagnosis not present

## 2021-06-12 DIAGNOSIS — E7800 Pure hypercholesterolemia, unspecified: Secondary | ICD-10-CM | POA: Diagnosis not present

## 2021-06-12 DIAGNOSIS — F4321 Adjustment disorder with depressed mood: Secondary | ICD-10-CM | POA: Diagnosis not present

## 2021-06-12 DIAGNOSIS — I10 Essential (primary) hypertension: Secondary | ICD-10-CM | POA: Diagnosis not present

## 2021-06-13 DIAGNOSIS — M545 Low back pain, unspecified: Secondary | ICD-10-CM | POA: Diagnosis not present

## 2021-06-13 DIAGNOSIS — R296 Repeated falls: Secondary | ICD-10-CM | POA: Diagnosis not present

## 2021-06-13 DIAGNOSIS — M6281 Muscle weakness (generalized): Secondary | ICD-10-CM | POA: Diagnosis not present

## 2021-06-13 DIAGNOSIS — R2681 Unsteadiness on feet: Secondary | ICD-10-CM | POA: Diagnosis not present

## 2021-06-15 DIAGNOSIS — R296 Repeated falls: Secondary | ICD-10-CM | POA: Diagnosis not present

## 2021-06-15 DIAGNOSIS — M545 Low back pain, unspecified: Secondary | ICD-10-CM | POA: Diagnosis not present

## 2021-06-15 DIAGNOSIS — R2681 Unsteadiness on feet: Secondary | ICD-10-CM | POA: Diagnosis not present

## 2021-06-15 DIAGNOSIS — M6281 Muscle weakness (generalized): Secondary | ICD-10-CM | POA: Diagnosis not present

## 2021-06-19 DIAGNOSIS — R2681 Unsteadiness on feet: Secondary | ICD-10-CM | POA: Diagnosis not present

## 2021-06-19 DIAGNOSIS — M6281 Muscle weakness (generalized): Secondary | ICD-10-CM | POA: Diagnosis not present

## 2021-06-19 DIAGNOSIS — R296 Repeated falls: Secondary | ICD-10-CM | POA: Diagnosis not present

## 2021-06-19 DIAGNOSIS — M545 Low back pain, unspecified: Secondary | ICD-10-CM | POA: Diagnosis not present

## 2021-06-21 DIAGNOSIS — M545 Low back pain, unspecified: Secondary | ICD-10-CM | POA: Diagnosis not present

## 2021-06-21 DIAGNOSIS — M6281 Muscle weakness (generalized): Secondary | ICD-10-CM | POA: Diagnosis not present

## 2021-06-21 DIAGNOSIS — R2681 Unsteadiness on feet: Secondary | ICD-10-CM | POA: Diagnosis not present

## 2021-06-21 DIAGNOSIS — R296 Repeated falls: Secondary | ICD-10-CM | POA: Diagnosis not present

## 2021-06-27 DIAGNOSIS — M545 Low back pain, unspecified: Secondary | ICD-10-CM | POA: Diagnosis not present

## 2021-06-27 DIAGNOSIS — R296 Repeated falls: Secondary | ICD-10-CM | POA: Diagnosis not present

## 2021-06-27 DIAGNOSIS — R2681 Unsteadiness on feet: Secondary | ICD-10-CM | POA: Diagnosis not present

## 2021-06-27 DIAGNOSIS — M6281 Muscle weakness (generalized): Secondary | ICD-10-CM | POA: Diagnosis not present

## 2021-06-28 DIAGNOSIS — L570 Actinic keratosis: Secondary | ICD-10-CM | POA: Diagnosis not present

## 2021-06-28 DIAGNOSIS — L579 Skin changes due to chronic exposure to nonionizing radiation, unspecified: Secondary | ICD-10-CM | POA: Diagnosis not present

## 2021-06-28 DIAGNOSIS — I872 Venous insufficiency (chronic) (peripheral): Secondary | ICD-10-CM | POA: Diagnosis not present

## 2021-06-28 DIAGNOSIS — C44319 Basal cell carcinoma of skin of other parts of face: Secondary | ICD-10-CM | POA: Diagnosis not present

## 2021-06-29 DIAGNOSIS — R296 Repeated falls: Secondary | ICD-10-CM | POA: Diagnosis not present

## 2021-06-29 DIAGNOSIS — M545 Low back pain, unspecified: Secondary | ICD-10-CM | POA: Diagnosis not present

## 2021-06-29 DIAGNOSIS — M6281 Muscle weakness (generalized): Secondary | ICD-10-CM | POA: Diagnosis not present

## 2021-06-29 DIAGNOSIS — R2681 Unsteadiness on feet: Secondary | ICD-10-CM | POA: Diagnosis not present

## 2021-06-30 ENCOUNTER — Other Ambulatory Visit: Payer: Self-pay | Admitting: Cardiology

## 2021-07-04 DIAGNOSIS — M545 Low back pain, unspecified: Secondary | ICD-10-CM | POA: Diagnosis not present

## 2021-07-04 DIAGNOSIS — R296 Repeated falls: Secondary | ICD-10-CM | POA: Diagnosis not present

## 2021-07-04 DIAGNOSIS — M6281 Muscle weakness (generalized): Secondary | ICD-10-CM | POA: Diagnosis not present

## 2021-07-04 DIAGNOSIS — R2681 Unsteadiness on feet: Secondary | ICD-10-CM | POA: Diagnosis not present

## 2021-07-06 DIAGNOSIS — M6281 Muscle weakness (generalized): Secondary | ICD-10-CM | POA: Diagnosis not present

## 2021-07-06 DIAGNOSIS — M545 Low back pain, unspecified: Secondary | ICD-10-CM | POA: Diagnosis not present

## 2021-07-06 DIAGNOSIS — R2681 Unsteadiness on feet: Secondary | ICD-10-CM | POA: Diagnosis not present

## 2021-07-06 DIAGNOSIS — R296 Repeated falls: Secondary | ICD-10-CM | POA: Diagnosis not present

## 2021-07-11 DIAGNOSIS — M545 Low back pain, unspecified: Secondary | ICD-10-CM | POA: Diagnosis not present

## 2021-07-11 DIAGNOSIS — R296 Repeated falls: Secondary | ICD-10-CM | POA: Diagnosis not present

## 2021-07-11 DIAGNOSIS — M6281 Muscle weakness (generalized): Secondary | ICD-10-CM | POA: Diagnosis not present

## 2021-07-11 DIAGNOSIS — R2681 Unsteadiness on feet: Secondary | ICD-10-CM | POA: Diagnosis not present

## 2021-07-13 DIAGNOSIS — R2681 Unsteadiness on feet: Secondary | ICD-10-CM | POA: Diagnosis not present

## 2021-07-13 DIAGNOSIS — M545 Low back pain, unspecified: Secondary | ICD-10-CM | POA: Diagnosis not present

## 2021-07-13 DIAGNOSIS — R296 Repeated falls: Secondary | ICD-10-CM | POA: Diagnosis not present

## 2021-07-13 DIAGNOSIS — M6281 Muscle weakness (generalized): Secondary | ICD-10-CM | POA: Diagnosis not present

## 2021-07-18 DIAGNOSIS — M6281 Muscle weakness (generalized): Secondary | ICD-10-CM | POA: Diagnosis not present

## 2021-07-18 DIAGNOSIS — R296 Repeated falls: Secondary | ICD-10-CM | POA: Diagnosis not present

## 2021-07-18 DIAGNOSIS — R2681 Unsteadiness on feet: Secondary | ICD-10-CM | POA: Diagnosis not present

## 2021-07-18 DIAGNOSIS — M545 Low back pain, unspecified: Secondary | ICD-10-CM | POA: Diagnosis not present

## 2021-07-20 DIAGNOSIS — M6281 Muscle weakness (generalized): Secondary | ICD-10-CM | POA: Diagnosis not present

## 2021-07-20 DIAGNOSIS — R296 Repeated falls: Secondary | ICD-10-CM | POA: Diagnosis not present

## 2021-07-20 DIAGNOSIS — M545 Low back pain, unspecified: Secondary | ICD-10-CM | POA: Diagnosis not present

## 2021-07-20 DIAGNOSIS — R2681 Unsteadiness on feet: Secondary | ICD-10-CM | POA: Diagnosis not present

## 2021-07-25 DIAGNOSIS — R2681 Unsteadiness on feet: Secondary | ICD-10-CM | POA: Diagnosis not present

## 2021-07-25 DIAGNOSIS — M545 Low back pain, unspecified: Secondary | ICD-10-CM | POA: Diagnosis not present

## 2021-07-25 DIAGNOSIS — M6281 Muscle weakness (generalized): Secondary | ICD-10-CM | POA: Diagnosis not present

## 2021-07-25 DIAGNOSIS — R296 Repeated falls: Secondary | ICD-10-CM | POA: Diagnosis not present

## 2021-07-27 DIAGNOSIS — M6281 Muscle weakness (generalized): Secondary | ICD-10-CM | POA: Diagnosis not present

## 2021-07-27 DIAGNOSIS — R296 Repeated falls: Secondary | ICD-10-CM | POA: Diagnosis not present

## 2021-07-27 DIAGNOSIS — M545 Low back pain, unspecified: Secondary | ICD-10-CM | POA: Diagnosis not present

## 2021-07-27 DIAGNOSIS — R2681 Unsteadiness on feet: Secondary | ICD-10-CM | POA: Diagnosis not present

## 2021-08-01 DIAGNOSIS — R296 Repeated falls: Secondary | ICD-10-CM | POA: Diagnosis not present

## 2021-08-01 DIAGNOSIS — M6281 Muscle weakness (generalized): Secondary | ICD-10-CM | POA: Diagnosis not present

## 2021-08-01 DIAGNOSIS — M545 Low back pain, unspecified: Secondary | ICD-10-CM | POA: Diagnosis not present

## 2021-08-01 DIAGNOSIS — R2681 Unsteadiness on feet: Secondary | ICD-10-CM | POA: Diagnosis not present

## 2021-08-03 DIAGNOSIS — R2681 Unsteadiness on feet: Secondary | ICD-10-CM | POA: Diagnosis not present

## 2021-08-03 DIAGNOSIS — M545 Low back pain, unspecified: Secondary | ICD-10-CM | POA: Diagnosis not present

## 2021-08-03 DIAGNOSIS — M6281 Muscle weakness (generalized): Secondary | ICD-10-CM | POA: Diagnosis not present

## 2021-08-03 DIAGNOSIS — R296 Repeated falls: Secondary | ICD-10-CM | POA: Diagnosis not present

## 2021-09-27 DIAGNOSIS — M48061 Spinal stenosis, lumbar region without neurogenic claudication: Secondary | ICD-10-CM | POA: Diagnosis not present

## 2021-09-27 DIAGNOSIS — M961 Postlaminectomy syndrome, not elsewhere classified: Secondary | ICD-10-CM | POA: Diagnosis not present

## 2021-10-25 DIAGNOSIS — L570 Actinic keratosis: Secondary | ICD-10-CM | POA: Diagnosis not present

## 2021-10-25 DIAGNOSIS — Z85828 Personal history of other malignant neoplasm of skin: Secondary | ICD-10-CM | POA: Diagnosis not present

## 2021-10-25 DIAGNOSIS — D492 Neoplasm of unspecified behavior of bone, soft tissue, and skin: Secondary | ICD-10-CM | POA: Diagnosis not present

## 2021-10-25 DIAGNOSIS — C44212 Basal cell carcinoma of skin of right ear and external auricular canal: Secondary | ICD-10-CM | POA: Diagnosis not present

## 2021-10-25 DIAGNOSIS — L579 Skin changes due to chronic exposure to nonionizing radiation, unspecified: Secondary | ICD-10-CM | POA: Diagnosis not present

## 2021-11-03 DIAGNOSIS — R059 Cough, unspecified: Secondary | ICD-10-CM | POA: Diagnosis not present

## 2021-11-07 DIAGNOSIS — M6281 Muscle weakness (generalized): Secondary | ICD-10-CM | POA: Diagnosis not present

## 2021-11-07 DIAGNOSIS — R296 Repeated falls: Secondary | ICD-10-CM | POA: Diagnosis not present

## 2021-11-07 DIAGNOSIS — R2681 Unsteadiness on feet: Secondary | ICD-10-CM | POA: Diagnosis not present

## 2021-11-07 DIAGNOSIS — M545 Low back pain, unspecified: Secondary | ICD-10-CM | POA: Diagnosis not present

## 2021-11-09 DIAGNOSIS — R296 Repeated falls: Secondary | ICD-10-CM | POA: Diagnosis not present

## 2021-11-09 DIAGNOSIS — M6281 Muscle weakness (generalized): Secondary | ICD-10-CM | POA: Diagnosis not present

## 2021-11-09 DIAGNOSIS — M545 Low back pain, unspecified: Secondary | ICD-10-CM | POA: Diagnosis not present

## 2021-11-09 DIAGNOSIS — R2681 Unsteadiness on feet: Secondary | ICD-10-CM | POA: Diagnosis not present

## 2021-11-14 DIAGNOSIS — M545 Low back pain, unspecified: Secondary | ICD-10-CM | POA: Diagnosis not present

## 2021-11-14 DIAGNOSIS — M6281 Muscle weakness (generalized): Secondary | ICD-10-CM | POA: Diagnosis not present

## 2021-11-14 DIAGNOSIS — R296 Repeated falls: Secondary | ICD-10-CM | POA: Diagnosis not present

## 2021-11-14 DIAGNOSIS — R2681 Unsteadiness on feet: Secondary | ICD-10-CM | POA: Diagnosis not present

## 2021-11-16 DIAGNOSIS — M545 Low back pain, unspecified: Secondary | ICD-10-CM | POA: Diagnosis not present

## 2021-11-16 DIAGNOSIS — R296 Repeated falls: Secondary | ICD-10-CM | POA: Diagnosis not present

## 2021-11-16 DIAGNOSIS — R2681 Unsteadiness on feet: Secondary | ICD-10-CM | POA: Diagnosis not present

## 2021-11-16 DIAGNOSIS — M6281 Muscle weakness (generalized): Secondary | ICD-10-CM | POA: Diagnosis not present

## 2021-11-20 DIAGNOSIS — R296 Repeated falls: Secondary | ICD-10-CM | POA: Diagnosis not present

## 2021-11-20 DIAGNOSIS — R2681 Unsteadiness on feet: Secondary | ICD-10-CM | POA: Diagnosis not present

## 2021-11-20 DIAGNOSIS — M545 Low back pain, unspecified: Secondary | ICD-10-CM | POA: Diagnosis not present

## 2021-11-20 DIAGNOSIS — M6281 Muscle weakness (generalized): Secondary | ICD-10-CM | POA: Diagnosis not present

## 2021-11-22 DIAGNOSIS — R2681 Unsteadiness on feet: Secondary | ICD-10-CM | POA: Diagnosis not present

## 2021-11-22 DIAGNOSIS — M545 Low back pain, unspecified: Secondary | ICD-10-CM | POA: Diagnosis not present

## 2021-11-22 DIAGNOSIS — R296 Repeated falls: Secondary | ICD-10-CM | POA: Diagnosis not present

## 2021-11-22 DIAGNOSIS — M6281 Muscle weakness (generalized): Secondary | ICD-10-CM | POA: Diagnosis not present

## 2021-11-27 DIAGNOSIS — R296 Repeated falls: Secondary | ICD-10-CM | POA: Diagnosis not present

## 2021-11-27 DIAGNOSIS — M545 Low back pain, unspecified: Secondary | ICD-10-CM | POA: Diagnosis not present

## 2021-11-27 DIAGNOSIS — M6281 Muscle weakness (generalized): Secondary | ICD-10-CM | POA: Diagnosis not present

## 2021-11-27 DIAGNOSIS — R2681 Unsteadiness on feet: Secondary | ICD-10-CM | POA: Diagnosis not present

## 2021-11-30 DIAGNOSIS — M545 Low back pain, unspecified: Secondary | ICD-10-CM | POA: Diagnosis not present

## 2021-11-30 DIAGNOSIS — M6281 Muscle weakness (generalized): Secondary | ICD-10-CM | POA: Diagnosis not present

## 2021-11-30 DIAGNOSIS — R2681 Unsteadiness on feet: Secondary | ICD-10-CM | POA: Diagnosis not present

## 2021-11-30 DIAGNOSIS — R296 Repeated falls: Secondary | ICD-10-CM | POA: Diagnosis not present

## 2021-12-04 DIAGNOSIS — R2681 Unsteadiness on feet: Secondary | ICD-10-CM | POA: Diagnosis not present

## 2021-12-04 DIAGNOSIS — M545 Low back pain, unspecified: Secondary | ICD-10-CM | POA: Diagnosis not present

## 2021-12-04 DIAGNOSIS — R296 Repeated falls: Secondary | ICD-10-CM | POA: Diagnosis not present

## 2021-12-04 DIAGNOSIS — M6281 Muscle weakness (generalized): Secondary | ICD-10-CM | POA: Diagnosis not present

## 2021-12-05 DIAGNOSIS — M961 Postlaminectomy syndrome, not elsewhere classified: Secondary | ICD-10-CM | POA: Diagnosis not present

## 2021-12-05 DIAGNOSIS — Z5181 Encounter for therapeutic drug level monitoring: Secondary | ICD-10-CM | POA: Diagnosis not present

## 2021-12-07 DIAGNOSIS — M6281 Muscle weakness (generalized): Secondary | ICD-10-CM | POA: Diagnosis not present

## 2021-12-07 DIAGNOSIS — M545 Low back pain, unspecified: Secondary | ICD-10-CM | POA: Diagnosis not present

## 2021-12-07 DIAGNOSIS — R296 Repeated falls: Secondary | ICD-10-CM | POA: Diagnosis not present

## 2021-12-07 DIAGNOSIS — R2681 Unsteadiness on feet: Secondary | ICD-10-CM | POA: Diagnosis not present

## 2021-12-11 DIAGNOSIS — R2681 Unsteadiness on feet: Secondary | ICD-10-CM | POA: Diagnosis not present

## 2021-12-11 DIAGNOSIS — M6281 Muscle weakness (generalized): Secondary | ICD-10-CM | POA: Diagnosis not present

## 2021-12-11 DIAGNOSIS — R296 Repeated falls: Secondary | ICD-10-CM | POA: Diagnosis not present

## 2021-12-11 DIAGNOSIS — M545 Low back pain, unspecified: Secondary | ICD-10-CM | POA: Diagnosis not present

## 2021-12-13 ENCOUNTER — Encounter: Payer: Self-pay | Admitting: Cardiology

## 2021-12-13 ENCOUNTER — Ambulatory Visit (INDEPENDENT_AMBULATORY_CARE_PROVIDER_SITE_OTHER): Payer: Medicare Other | Admitting: Cardiology

## 2021-12-13 VITALS — BP 140/60 | HR 55 | Ht 67.0 in | Wt 150.6 lb

## 2021-12-13 DIAGNOSIS — I10 Essential (primary) hypertension: Secondary | ICD-10-CM

## 2021-12-13 DIAGNOSIS — Z79899 Other long term (current) drug therapy: Secondary | ICD-10-CM | POA: Diagnosis not present

## 2021-12-13 DIAGNOSIS — I491 Atrial premature depolarization: Secondary | ICD-10-CM

## 2021-12-13 DIAGNOSIS — I493 Ventricular premature depolarization: Secondary | ICD-10-CM

## 2021-12-13 DIAGNOSIS — I4729 Other ventricular tachycardia: Secondary | ICD-10-CM | POA: Diagnosis not present

## 2021-12-13 DIAGNOSIS — I471 Supraventricular tachycardia: Secondary | ICD-10-CM

## 2021-12-13 NOTE — Patient Instructions (Signed)
Medication Instructions:  Your physician recommends that you continue on your current medications as directed. Please refer to the Current Medication list given to you today.  *If you need a refill on your cardiac medications before your next appointment, please call your pharmacy*   Lab Work: BMET, Magnesium and CBC If you have labs (blood work) drawn today and your tests are completely normal, you will receive your results only by: [place] (if you have MyChart) OR A paper copy in the mail If you have any lab test that is abnormal or we need to change your treatment, we will call you to review the results.   Testing/Procedures: NONE   Follow-Up: At [facility], you and your health needs are our priority.  As part of our continuing mission to provide you with exceptional heart care, we have created designated Provider Care Teams.  These Care Teams include your primary Cardiologist (physician) and Advanced Practice Providers (APPs -  Physician Assistants and Nurse Practitioners) who all work together to provide you with the care you need, when you need it.  We recommend signing up for the patient portal called "MyChart".  Sign up information is provided on this After Visit Summary.  MyChart is used to connect with patients for Virtual Visits (Telemedicine).  Patients are able to view lab/test results, encounter notes, upcoming appointments, etc.  Non-urgent messages can be sent to your provider as well.   To learn more about what you can do with MyChart, go to [email or web address].    Your next appointment:   1 year(s)  The format for your next appointment:   In Person  Provider:   Berniece Salines, DO

## 2021-12-13 NOTE — Progress Notes (Signed)
Cardiology Office Note:    Date:  12/14/2021   ID:  Aaron Chang, DOB April 26, 1931, MRN [id number]  PCP:  Seward Carol, MD  Cardiologist:  Berniece Salines, DO  Electrophysiologist:  None   Referring MD: Seward Carol, MD   " I am doing ok"  History of Present Illness:    Aaron Chang is a 88 y.o. male with a hx of frequent PVCs, nonsustained ventricular tachycardia which was seen on the monitor, sleep apnea is here today for follow-up visit.  Since I saw the patient he has been doing well from a cardiovascular standpoint.  No hospitalizations.  No ED visit.  He is here today with his wife.  Past Medical History:  Diagnosis Date   Cervical radiculopathy 2003   Hx of colonoscopy 2006   2011   Hypercholesterolemia    side effects to lipitor and zocor   Hypertension    Kidney stone 2014   Lumbar radiculopathy 2006   PVC's (premature ventricular contractions)    Severe sleep apnea 08/06/2011    Past Surgical History:  Procedure Laterality Date   CATARACT EXTRACTION, BILATERAL  2017   CERVICAL DISCECTOMY  2011   ELBOW ARTHROPLASTY  2012   FOOT SURGERY Right    LUMBAR FUSION  2020   ROTATOR CUFF REPAIR Right 1996   ROTATOR CUFF REPAIR Left 2015   TARSAL NAVICULAR ARTHODESIS  1940   VASECTOMY  1968    Current Medications: Current Meds  Medication Sig   amiodarone (PACERONE) 200 MG tablet Take 1 tablet (200 mg total) by mouth daily.   ascorbic acid (VITAMIN C) 100 MG tablet Take 1 tablet by mouth daily.   Cholecalciferol (VITAMIN D) 125 MCG (5000 UT) CAPS Take 1 tablet by mouth daily.   Flaxseed, Linseed, (FLAX SEED OIL) 1000 MG CAPS Take 1 capsule by mouth See admin instructions.   hydrochlorothiazide (HYDRODIURIL) 12.5 MG tablet Take 1 tablet by mouth daily.   methocarbamol (ROBAXIN) 500 MG tablet 1 tablet   Multiple Vitamin (MULTI-VITAMIN) tablet Take 1 tablet by mouth daily.   Omega-3 Fatty Acids (FISH OIL) 1200 MG CPDR Take 2 capsules by mouth daily.   Zinc 100  MG TABS Take 1 tablet by mouth daily.   [DISCONTINUED] aspirin EC 81 MG tablet Take 1 tablet (81 mg total) by mouth daily. Swallow whole.   [DISCONTINUED] HYDROcodone-acetaminophen (NORCO/VICODIN) 5-325 MG tablet Take 1 tablet by mouth daily.   [DISCONTINUED] mirtazapine (REMERON) 7.5 MG tablet Take 7.5 mg by mouth at bedtime.     Allergies:   Chocolate flavor, Flomax [tamsulosin], Statins, Atenolol, Atorvastatin, Chocolate, Codeine, Other, and Diazepam   Social History   Socioeconomic History   Marital status: Married    Spouse name: Not on file   Number of children: Not on file   Years of education: Not on file   Highest education level: Not on file  Occupational History   Not on file  Tobacco Use   Smoking status: Never   Smokeless tobacco: Never  Vaping Use   Vaping Use: Never used  Substance and Sexual Activity   Alcohol use: Not Currently   Drug use: Not on file   Sexual activity: Not on file  Other Topics Concern   Not on file  Social History Narrative   Not on file   Social Determinants of Health   Financial Resource Strain: Not on file  Food Insecurity: Not on file  Transportation Needs: Not on file  Physical Activity: Not on  file  Stress: Not on file  Social Connections: Not on file     Family History: The patient's family history is not on file.  ROS:   Review of Systems  Constitution: Negative for decreased appetite, fever and weight gain.  HENT: Negative for congestion, ear discharge, hoarse voice and sore throat.   Eyes: Negative for discharge, redness, vision loss in right eye and visual halos.  Cardiovascular: Negative for chest pain, dyspnea on exertion, leg swelling, orthopnea and palpitations.  Respiratory: Negative for cough, hemoptysis, shortness of breath and snoring.   Endocrine: Negative for heat intolerance and polyphagia.  Hematologic/Lymphatic: Negative for bleeding problem. Does not bruise/bleed easily.  Skin: Negative for flushing,  nail changes, rash and suspicious lesions.  Musculoskeletal: Negative for arthritis, joint pain, muscle cramps, myalgias, neck pain and stiffness.  Gastrointestinal: Negative for abdominal pain, bowel incontinence, diarrhea and excessive appetite.  Genitourinary: Negative for decreased libido, genital sores and incomplete emptying.  Neurological: Negative for brief paralysis, focal weakness, headaches and loss of balance.  Psychiatric/Behavioral: Negative for altered mental status, depression and suicidal ideas.  Allergic/Immunologic: Negative for HIV exposure and persistent infections.    EKGs/Labs/Other Studies Reviewed:    The following studies were reviewed today:   EKG:  The ekg ordered today demonstrates   ZIO monitor which was placed on him by his primary care provider on June 21, 2020.  The patient wore the ZIO monitor for 6 days 18 hours.  His minimum heart rate was 46, maximum heart rate was 197, average heart rate was 66.  He had overall 227 episodes of nonsustained ventricular tachycardia with the fastest heartbeat rate at 197 bpm longest 4 beats.  He had a total of 1684 supraventricular tachycardia with the fastest heart rate at 184 bpm and the longest episode lasting 18.6 seconds.  There were frequent PACs 5.7%.  Frequent PVCs 12%.  No pauses, no AV block, no atrial fibrillation.   Transthoracic echocardiogram August 11, 2020 IMPRESSIONS     1. Left ventricular ejection fraction, by estimation, is 50 to 55%. The  left ventricle has low normal function. The left ventricle has no regional  wall motion abnormalities. There is moderate concentric left ventricular  hypertrophy. Left ventricular  diastolic parameters are consistent with Grade I diastolic dysfunction  (impaired relaxation).   2. Right ventricular systolic function is normal. The right ventricular  size is normal. There is mildly elevated pulmonary artery systolic  pressure.   3. The mitral valve is normal in  structure. Mild mitral valve  regurgitation. No evidence of mitral stenosis.   4. The aortic valve is tricuspid. Aortic valve regurgitation is mild.  Mild aortic valve sclerosis is present, with no evidence of aortic valve  stenosis.   5. There is borderline dilatation of the ascending aorta, measuring 36  mm.   6. The inferior vena cava is normal in size with greater than 50%  respiratory variability, suggesting right atrial pressure of 3 mmHg.   FINDINGS   Left Ventricle: Left ventricular ejection fraction, by estimation, is 50  to 55%. The left ventricle has low normal function. The left ventricle has  no regional wall motion abnormalities. The left ventricular internal  cavity size was normal in size.  There is moderate concentric left ventricular hypertrophy. Abnormal  (paradoxical) septal motion, consistent with left bundle branch block.  Left ventricular diastolic parameters are consistent with Grade I  diastolic dysfunction (impaired relaxation).  Indeterminate filling pressures.   Right Ventricle: The right ventricular  size is normal. No increase in  right ventricular wall thickness. Right ventricular systolic function is  normal. There is mildly elevated pulmonary artery systolic pressure. The  tricuspid regurgitant velocity is 2.90   m/s, and with an assumed right atrial pressure of 3 mmHg, the estimated  right ventricular systolic pressure is 25.9 mmHg.   Left Atrium: Left atrial size was normal in size.   Right Atrium: Right atrial size was normal in size.   Pericardium: There is no evidence of pericardial effusion.   Mitral Valve: The mitral valve is normal in structure. Mild mitral valve  regurgitation. No evidence of mitral valve stenosis.   Tricuspid Valve: The tricuspid valve is normal in structure. Tricuspid  valve regurgitation is mild . No evidence of tricuspid stenosis.   Aortic Valve: The aortic valve is tricuspid. Aortic valve regurgitation is  mild.  Mild aortic valve sclerosis is present, with no evidence of aortic  valve stenosis.   Pulmonic Valve: The pulmonic valve was normal in structure. Pulmonic valve  regurgitation is not visualized. No evidence of pulmonic stenosis.   Aorta: The aortic root is normal in size and structure and the aortic arch  was not well visualized. There is borderline dilatation of the ascending  aorta, measuring 36 mm.   Venous: The pulmonary veins were not well visualized. The inferior vena  cava is normal in size with greater than 50% respiratory variability,  suggesting right atrial pressure of 3 mmHg.   IAS/Shunts: No atrial level shunt detected by color flow Doppler.   Recent Labs: 12/13/2021: BUN 15; Creatinine, Ser 1.01; Hemoglobin 13.0; Magnesium 2.2; Platelets 194; Potassium 4.1; Sodium 142  Recent Lipid Panel No results found for: "CHOL", "TRIG", "HDL", "CHOLHDL", "VLDL", "LDLCALC", "LDLDIRECT"  Physical Exam:    VS:  BP 140/60   Pulse (!) 55   Ht '5\' 7"'$  (1.702 m)   Wt 150 lb 9.6 oz (68.3 kg)   SpO2 98%   BMI 23.59 kg/m     Wt Readings from Last 3 Encounters:  12/13/21 150 lb 9.6 oz (68.3 kg)  12/05/20 155 lb (70.3 kg)  11/15/20 164 lb (74.4 kg)     GEN: Well nourished, well developed in no acute distress HEENT: Normal NECK: No JVD; No carotid bruits LYMPHATICS: No lymphadenopathy CARDIAC: S1S2 noted,RRR, no murmurs, rubs, gallops RESPIRATORY:  Clear to auscultation without rales, wheezing or rhonchi  ABDOMEN: Soft, non-tender, non-distended, +bowel sounds, no guarding. EXTREMITIES: No edema, No cyanosis, no clubbing MUSCULOSKELETAL:  No deformity  SKIN: Warm and dry NEUROLOGIC:  Alert and oriented x 3, non-focal PSYCHIATRIC:  Normal affect, good insight  ASSESSMENT:    1. Medication management   2. Frequent PVCs   3. Supraventricular premature beats   4. Primary hypertension   5. NSVT (nonsustained ventricular tachycardia) (HCC)   6. PAT (paroxysmal atrial tachycardia)  (HCC)    PLAN:     1.  He is doing well from a cardiovascular standpoint.  No change in medications made today.  He is looking forward to his 89th birthday next year.  The patient is in agreement with the above plan. The patient left the office in stable condition.  The patient will follow up in   Medication Adjustments/Labs and Tests Ordered: Current medicines are reviewed at length with the patient today.  Concerns regarding medicines are outlined above.  Orders Placed This Encounter  Procedures   CBC   Basic Metabolic Panel (BMET)   Magnesium   EKG 12-Lead  No orders of the defined types were placed in this encounter.   Patient Instructions  Medication Instructions:  Your physician recommends that you continue on your current medications as directed. Please refer to the Current Medication list given to you today.  *If you need a refill on your cardiac medications before your next appointment, please call your pharmacy*   Lab Work: BMET, Magnesium and CBC If you have labs (blood work) drawn today and your tests are completely normal, you will receive your results only by: [place] (if you have MyChart) OR A paper copy in the mail If you have any lab test that is abnormal or we need to change your treatment, we will call you to review the results.   Testing/Procedures: NONE   Follow-Up: At [facility], you and your health needs are our priority.  As part of our continuing mission to provide you with exceptional heart care, we have created designated Provider Care Teams.  These Care Teams include your primary Cardiologist (physician) and Advanced Practice Providers (APPs -  Physician Assistants and Nurse Practitioners) who all work together to provide you with the care you need, when you need it.  We recommend signing up for the patient portal called "MyChart".  Sign up information is provided on this After Visit Summary.  MyChart is used to connect with patients  for Virtual Visits (Telemedicine).  Patients are able to view lab/test results, encounter notes, upcoming appointments, etc.  Non-urgent messages can be sent to your provider as well.   To learn more about what you can do with MyChart, go to [email or web address].    Your next appointment:   1 year(s)  The format for your next appointment:   In Person  Provider:   Berniece Salines, DO     Adopting a Healthy Lifestyle.  Know what a healthy weight is for you (roughly BMI <25) and aim to maintain this   Aim for 7+ servings of fruits and vegetables daily   65-80+ fluid ounces of water or unsweet tea for healthy kidneys   Limit to max 1 drink of alcohol per day; avoid smoking/tobacco   Limit animal fats in diet for cholesterol and heart health - choose grass fed whenever available   Avoid highly processed foods, and foods high in saturated/trans fats   Aim for low stress - take time to unwind and care for your mental health   Aim for 150 min of moderate intensity exercise weekly for heart health, and weights twice weekly for bone health   Aim for 7-9 hours of sleep daily   When it comes to diets, agreement about the perfect plan isnt easy to find, even among the experts. Experts at the [employer] [place] developed an idea known as the Healthy Eating Plate. Just imagine a plate divided into logical, healthy portions.   The emphasis is on diet quality:   Load up on vegetables and fruits - one-half of your plate: Aim for color and variety, and remember that potatoes dont count.   Go for whole grains - one-quarter of your plate: Whole wheat, barley, wheat berries, quinoa, oats, brown rice, and foods made with them. If you want pasta, go with whole wheat pasta.   Protein power - one-quarter of your plate: Fish, chicken, beans, and nuts are all healthy, versatile protein sources. Limit red meat.   The diet, however, does go beyond the plate, offering a few other  suggestions.   Use healthy plant oils,  such as olive, canola, soy, corn, sunflower and peanut. Check the labels, and avoid partially hydrogenated oil, which have unhealthy trans fats.   If youre thirsty, drink water. Coffee and tea are good in moderation, but skip sugary drinks and limit milk and dairy products to one or two daily servings.   The type of carbohydrate in the diet is more important than the amount. Some sources of carbohydrates, such as vegetables, fruits, whole grains, and beans-are healthier than others.   Finally, stay active  Signed, Berniece Salines, DO  12/14/2021 8:54 PM    [facility]

## 2021-12-14 DIAGNOSIS — M6281 Muscle weakness (generalized): Secondary | ICD-10-CM | POA: Diagnosis not present

## 2021-12-14 DIAGNOSIS — R2681 Unsteadiness on feet: Secondary | ICD-10-CM | POA: Diagnosis not present

## 2021-12-14 DIAGNOSIS — R296 Repeated falls: Secondary | ICD-10-CM | POA: Diagnosis not present

## 2021-12-14 DIAGNOSIS — M545 Low back pain, unspecified: Secondary | ICD-10-CM | POA: Diagnosis not present

## 2021-12-14 LAB — CBC
Hematocrit: 39.9 % (ref 37.5–51.0)
Hemoglobin: 13 g/dL (ref 13.0–17.7)
MCH: 29.1 pg (ref 26.6–33.0)
MCHC: 32.6 g/dL (ref 31.5–35.7)
MCV: 90 fL (ref 79–97)
Platelets: 194 10*3/uL (ref 150–450)
RBC: 4.46 x10E6/uL (ref 4.14–5.80)
RDW: 14.2 % (ref 11.6–15.4)
WBC: 6.8 10*3/uL (ref 3.4–10.8)

## 2021-12-14 LAB — BASIC METABOLIC PANEL
BUN/Creatinine Ratio: 15 (ref 10–24)
BUN: 15 mg/dL (ref 8–27)
CO2: 25 mmol/L (ref 20–29)
Calcium: 9.5 mg/dL (ref 8.6–10.2)
Chloride: 104 mmol/L (ref 96–106)
Creatinine, Ser: 1.01 mg/dL (ref 0.76–1.27)
Glucose: 104 mg/dL — ABNORMAL HIGH (ref 70–99)
Potassium: 4.1 mmol/L (ref 3.5–5.2)
Sodium: 142 mmol/L (ref 134–144)
eGFR: 71 mL/min/{1.73_m2} (ref >59)

## 2021-12-14 LAB — MAGNESIUM: Magnesium: 2.2 mg/dL (ref 1.6–2.3)

## 2021-12-21 DIAGNOSIS — R296 Repeated falls: Secondary | ICD-10-CM | POA: Diagnosis not present

## 2021-12-21 DIAGNOSIS — M545 Low back pain, unspecified: Secondary | ICD-10-CM | POA: Diagnosis not present

## 2021-12-21 DIAGNOSIS — M6281 Muscle weakness (generalized): Secondary | ICD-10-CM | POA: Diagnosis not present

## 2021-12-21 DIAGNOSIS — R2681 Unsteadiness on feet: Secondary | ICD-10-CM | POA: Diagnosis not present

## 2022-01-08 DIAGNOSIS — R2681 Unsteadiness on feet: Secondary | ICD-10-CM | POA: Diagnosis not present

## 2022-01-08 DIAGNOSIS — M6281 Muscle weakness (generalized): Secondary | ICD-10-CM | POA: Diagnosis not present

## 2022-01-08 DIAGNOSIS — R296 Repeated falls: Secondary | ICD-10-CM | POA: Diagnosis not present

## 2022-01-08 DIAGNOSIS — M545 Low back pain, unspecified: Secondary | ICD-10-CM | POA: Diagnosis not present

## 2022-01-11 DIAGNOSIS — R296 Repeated falls: Secondary | ICD-10-CM | POA: Diagnosis not present

## 2022-01-11 DIAGNOSIS — R2681 Unsteadiness on feet: Secondary | ICD-10-CM | POA: Diagnosis not present

## 2022-01-11 DIAGNOSIS — M545 Low back pain, unspecified: Secondary | ICD-10-CM | POA: Diagnosis not present

## 2022-01-11 DIAGNOSIS — M6281 Muscle weakness (generalized): Secondary | ICD-10-CM | POA: Diagnosis not present

## 2022-01-15 ENCOUNTER — Ambulatory Visit: Payer: Self-pay | Admitting: Licensed Clinical Social Worker

## 2022-01-15 DIAGNOSIS — M545 Low back pain, unspecified: Secondary | ICD-10-CM | POA: Diagnosis not present

## 2022-01-15 DIAGNOSIS — R296 Repeated falls: Secondary | ICD-10-CM | POA: Diagnosis not present

## 2022-01-15 DIAGNOSIS — R2681 Unsteadiness on feet: Secondary | ICD-10-CM | POA: Diagnosis not present

## 2022-01-15 DIAGNOSIS — M6281 Muscle weakness (generalized): Secondary | ICD-10-CM | POA: Diagnosis not present

## 2022-01-15 NOTE — Patient Outreach (Signed)
  Care Coordination   01/15/2022 Name: Aaron Chang MRN: [id number] DOB: 1930-08-16   Care Coordination Outreach Attempts:  An unsuccessful telephone outreach was attempted today to offer the patient information about available care coordination services as a benefit of their health plan.   Follow Up Plan:  Additional outreach attempts will be made to offer the patient care coordination information and services.   Encounter Outcome:  No Answer  Care Coordination Interventions Activated:  No   Care Coordination Interventions:  No, not indicated    Casimer Lanius, [place] [phone]

## 2022-01-17 ENCOUNTER — Ambulatory Visit: Payer: Self-pay | Admitting: Licensed Clinical Social Worker

## 2022-01-17 NOTE — Patient Instructions (Signed)
Visit Information  Thank you for taking time to visit with me today. Please don't hesitate to contact me if I can be of assistance to you.   Following are the goals we discussed today:   Goals Addressed             This Visit's Progress    Care Coordination Services       Care Coordination Interventions: Provided education to patient re: Care Coordination Services Caregiver stress acknowledged         Our next appointment is by telephone on Sept 5th at 11:00  Please call the care guide team at [phone] if you need to cancel or reschedule your appointment.   Casimer Lanius, [place] [phone]

## 2022-01-17 NOTE — Patient Outreach (Signed)
  Care Coordination  Initial Visit Note   01/17/2022 Name: Aaron Chang MRN: [id number] DOB: 30-May-1930  Aaron Chang is a 88 y.o. year old male who sees Seward Carol, MD for primary care. I spoke with  Aaron Chang 's wife by phone today.  What matters to the patients health and wellness today?  Caregiver stress    Goals Addressed             This Visit's Progress    Care Coordination Services       Care Coordination Interventions: Provided education to patient re: Care Coordination Services Caregiver stress acknowledged          SDOH assessments and interventions completed:  No    Care Coordination Interventions Activated:  Yes  Care Coordination Interventions:  Yes, provided   Follow up plan: Follow up call scheduled for 01/23/22    Encounter Outcome:  Pt. Visit Completed   Casimer Lanius, [place] [facility] [phone]

## 2022-01-18 DIAGNOSIS — M545 Low back pain, unspecified: Secondary | ICD-10-CM | POA: Diagnosis not present

## 2022-01-18 DIAGNOSIS — M6281 Muscle weakness (generalized): Secondary | ICD-10-CM | POA: Diagnosis not present

## 2022-01-18 DIAGNOSIS — R2681 Unsteadiness on feet: Secondary | ICD-10-CM | POA: Diagnosis not present

## 2022-01-18 DIAGNOSIS — R296 Repeated falls: Secondary | ICD-10-CM | POA: Diagnosis not present

## 2022-01-23 ENCOUNTER — Ambulatory Visit: Payer: Self-pay | Admitting: Licensed Clinical Social Worker

## 2022-01-23 DIAGNOSIS — R2681 Unsteadiness on feet: Secondary | ICD-10-CM | POA: Diagnosis not present

## 2022-01-23 DIAGNOSIS — M6281 Muscle weakness (generalized): Secondary | ICD-10-CM | POA: Diagnosis not present

## 2022-01-23 DIAGNOSIS — M545 Low back pain, unspecified: Secondary | ICD-10-CM | POA: Diagnosis not present

## 2022-01-23 DIAGNOSIS — R296 Repeated falls: Secondary | ICD-10-CM | POA: Diagnosis not present

## 2022-01-23 NOTE — Patient Outreach (Signed)
  Care Coordination   Initial Visit Note   01/23/2022 Name: CHRISTERPHER CLOS MRN: [id number] DOB: 09-04-1930  DAMAREA MERKEL is a 88 y.o. year old male who sees Seward Carol, MD for primary care. I spoke with  Yvonna Alanis by phone today.  What matters to the patients health and wellness today?     Patient reports no concerns or needs from Care Coordination team with health and wellness related to physical or mental heath.  Patient was accompanied by his wife who provided information during this encounter. Reviewed benefits of Medicare Annual Wellness Visit.     Goals Addressed             This Visit's Progress    COMPLETED: Care Coordination Activites / No Follow up required       Care Coordination Interventions: Provided education to patient re: Care Coordination Services PHQ2/PHQ9 completed Solution-Focused Strategies employed:  Active listening / Reflection utilized  Caregiver stress acknowledged  Consideration of in-home help encouraged : options discussed        SDOH assessments and interventions completed:  Yes  SDOH Interventions Today    Flowsheet Row Most Recent Value  SDOH Interventions   Housing Interventions Intervention Not Indicated  Transportation Interventions Intervention Not Indicated  Stress Interventions Intervention Not Indicated        Care Coordination Interventions Activated:  Yes  Care Coordination Interventions:  Yes, provided   Follow up plan: No further intervention required.   Encounter Outcome:  Pt. Visit Completed   Casimer Lanius, [place] [phone]

## 2022-01-23 NOTE — Patient Instructions (Signed)
Visit Information  Thank you for taking time to visit with me today. Please don't hesitate to contact me if I can be of assistance to you.   Following are the goals we discussed today:   Goals Addressed             This Visit's Progress    COMPLETED: Care Coordination Activites / No Follow up required       Care Coordination Interventions: Provided education to patient re: Care Coordination Services PHQ2/PHQ9 completed Solution-Focused Strategies employed:  Active listening / Reflection utilized  Caregiver stress acknowledged  Consideration of in-home help encouraged : options discussed        Please call the care guide team at [phone] if you need to cancel or reschedule your appointment.   If you are experiencing a Mental Health or [place] or need someone to talk to, please call the Suicide and Crisis Lifeline: 988 call 1-800-273-TALK (toll free, 24 hour hotline)   Patient verbalizes understanding of instructions and care plan provided today and agrees to view in [place]. Active MyChart status and patient understanding of how to access instructions and care plan via MyChart confirmed with patient.     No further follow up required: by Care Coordination no needs were identified.  Casimer Lanius, [place] [phone]

## 2022-01-25 DIAGNOSIS — M6281 Muscle weakness (generalized): Secondary | ICD-10-CM | POA: Diagnosis not present

## 2022-01-25 DIAGNOSIS — R296 Repeated falls: Secondary | ICD-10-CM | POA: Diagnosis not present

## 2022-01-25 DIAGNOSIS — M545 Low back pain, unspecified: Secondary | ICD-10-CM | POA: Diagnosis not present

## 2022-01-25 DIAGNOSIS — R2681 Unsteadiness on feet: Secondary | ICD-10-CM | POA: Diagnosis not present

## 2022-01-30 DIAGNOSIS — M6281 Muscle weakness (generalized): Secondary | ICD-10-CM | POA: Diagnosis not present

## 2022-01-30 DIAGNOSIS — R296 Repeated falls: Secondary | ICD-10-CM | POA: Diagnosis not present

## 2022-01-30 DIAGNOSIS — M545 Low back pain, unspecified: Secondary | ICD-10-CM | POA: Diagnosis not present

## 2022-01-30 DIAGNOSIS — R2681 Unsteadiness on feet: Secondary | ICD-10-CM | POA: Diagnosis not present

## 2022-02-01 DIAGNOSIS — R296 Repeated falls: Secondary | ICD-10-CM | POA: Diagnosis not present

## 2022-02-01 DIAGNOSIS — M6281 Muscle weakness (generalized): Secondary | ICD-10-CM | POA: Diagnosis not present

## 2022-02-01 DIAGNOSIS — M545 Low back pain, unspecified: Secondary | ICD-10-CM | POA: Diagnosis not present

## 2022-02-01 DIAGNOSIS — R2681 Unsteadiness on feet: Secondary | ICD-10-CM | POA: Diagnosis not present

## 2022-02-07 DIAGNOSIS — L579 Skin changes due to chronic exposure to nonionizing radiation, unspecified: Secondary | ICD-10-CM | POA: Diagnosis not present

## 2022-02-07 DIAGNOSIS — L570 Actinic keratosis: Secondary | ICD-10-CM | POA: Diagnosis not present

## 2022-02-08 DIAGNOSIS — R296 Repeated falls: Secondary | ICD-10-CM | POA: Diagnosis not present

## 2022-02-08 DIAGNOSIS — M545 Low back pain, unspecified: Secondary | ICD-10-CM | POA: Diagnosis not present

## 2022-02-08 DIAGNOSIS — R2681 Unsteadiness on feet: Secondary | ICD-10-CM | POA: Diagnosis not present

## 2022-02-08 DIAGNOSIS — M6281 Muscle weakness (generalized): Secondary | ICD-10-CM | POA: Diagnosis not present

## 2022-02-13 DIAGNOSIS — R296 Repeated falls: Secondary | ICD-10-CM | POA: Diagnosis not present

## 2022-02-13 DIAGNOSIS — M6281 Muscle weakness (generalized): Secondary | ICD-10-CM | POA: Diagnosis not present

## 2022-02-13 DIAGNOSIS — M545 Low back pain, unspecified: Secondary | ICD-10-CM | POA: Diagnosis not present

## 2022-02-13 DIAGNOSIS — R2681 Unsteadiness on feet: Secondary | ICD-10-CM | POA: Diagnosis not present

## 2022-02-15 DIAGNOSIS — M6281 Muscle weakness (generalized): Secondary | ICD-10-CM | POA: Diagnosis not present

## 2022-02-15 DIAGNOSIS — R2681 Unsteadiness on feet: Secondary | ICD-10-CM | POA: Diagnosis not present

## 2022-02-15 DIAGNOSIS — R296 Repeated falls: Secondary | ICD-10-CM | POA: Diagnosis not present

## 2022-02-15 DIAGNOSIS — M545 Low back pain, unspecified: Secondary | ICD-10-CM | POA: Diagnosis not present

## 2022-02-20 DIAGNOSIS — R2681 Unsteadiness on feet: Secondary | ICD-10-CM | POA: Diagnosis not present

## 2022-02-20 DIAGNOSIS — R296 Repeated falls: Secondary | ICD-10-CM | POA: Diagnosis not present

## 2022-02-20 DIAGNOSIS — M6281 Muscle weakness (generalized): Secondary | ICD-10-CM | POA: Diagnosis not present

## 2022-02-20 DIAGNOSIS — M545 Low back pain, unspecified: Secondary | ICD-10-CM | POA: Diagnosis not present

## 2022-02-22 DIAGNOSIS — R2681 Unsteadiness on feet: Secondary | ICD-10-CM | POA: Diagnosis not present

## 2022-02-22 DIAGNOSIS — M545 Low back pain, unspecified: Secondary | ICD-10-CM | POA: Diagnosis not present

## 2022-02-22 DIAGNOSIS — R296 Repeated falls: Secondary | ICD-10-CM | POA: Diagnosis not present

## 2022-02-22 DIAGNOSIS — M6281 Muscle weakness (generalized): Secondary | ICD-10-CM | POA: Diagnosis not present

## 2022-02-27 DIAGNOSIS — M545 Low back pain, unspecified: Secondary | ICD-10-CM | POA: Diagnosis not present

## 2022-02-27 DIAGNOSIS — R296 Repeated falls: Secondary | ICD-10-CM | POA: Diagnosis not present

## 2022-02-27 DIAGNOSIS — M6281 Muscle weakness (generalized): Secondary | ICD-10-CM | POA: Diagnosis not present

## 2022-02-27 DIAGNOSIS — R2681 Unsteadiness on feet: Secondary | ICD-10-CM | POA: Diagnosis not present

## 2022-03-01 DIAGNOSIS — M6281 Muscle weakness (generalized): Secondary | ICD-10-CM | POA: Diagnosis not present

## 2022-03-01 DIAGNOSIS — R296 Repeated falls: Secondary | ICD-10-CM | POA: Diagnosis not present

## 2022-03-01 DIAGNOSIS — M545 Low back pain, unspecified: Secondary | ICD-10-CM | POA: Diagnosis not present

## 2022-03-01 DIAGNOSIS — R2681 Unsteadiness on feet: Secondary | ICD-10-CM | POA: Diagnosis not present

## 2022-03-05 DIAGNOSIS — R296 Repeated falls: Secondary | ICD-10-CM | POA: Diagnosis not present

## 2022-03-05 DIAGNOSIS — R2681 Unsteadiness on feet: Secondary | ICD-10-CM | POA: Diagnosis not present

## 2022-03-05 DIAGNOSIS — M545 Low back pain, unspecified: Secondary | ICD-10-CM | POA: Diagnosis not present

## 2022-03-05 DIAGNOSIS — M6281 Muscle weakness (generalized): Secondary | ICD-10-CM | POA: Diagnosis not present

## 2022-03-13 DIAGNOSIS — R296 Repeated falls: Secondary | ICD-10-CM | POA: Diagnosis not present

## 2022-03-13 DIAGNOSIS — R2681 Unsteadiness on feet: Secondary | ICD-10-CM | POA: Diagnosis not present

## 2022-03-13 DIAGNOSIS — M545 Low back pain, unspecified: Secondary | ICD-10-CM | POA: Diagnosis not present

## 2022-03-13 DIAGNOSIS — M6281 Muscle weakness (generalized): Secondary | ICD-10-CM | POA: Diagnosis not present

## 2022-03-15 DIAGNOSIS — M6281 Muscle weakness (generalized): Secondary | ICD-10-CM | POA: Diagnosis not present

## 2022-03-15 DIAGNOSIS — M545 Low back pain, unspecified: Secondary | ICD-10-CM | POA: Diagnosis not present

## 2022-03-15 DIAGNOSIS — R2681 Unsteadiness on feet: Secondary | ICD-10-CM | POA: Diagnosis not present

## 2022-03-15 DIAGNOSIS — R296 Repeated falls: Secondary | ICD-10-CM | POA: Diagnosis not present

## 2022-03-20 DIAGNOSIS — M6281 Muscle weakness (generalized): Secondary | ICD-10-CM | POA: Diagnosis not present

## 2022-03-20 DIAGNOSIS — R296 Repeated falls: Secondary | ICD-10-CM | POA: Diagnosis not present

## 2022-03-20 DIAGNOSIS — M545 Low back pain, unspecified: Secondary | ICD-10-CM | POA: Diagnosis not present

## 2022-03-20 DIAGNOSIS — R2681 Unsteadiness on feet: Secondary | ICD-10-CM | POA: Diagnosis not present

## 2022-03-22 DIAGNOSIS — R296 Repeated falls: Secondary | ICD-10-CM | POA: Diagnosis not present

## 2022-03-22 DIAGNOSIS — R2681 Unsteadiness on feet: Secondary | ICD-10-CM | POA: Diagnosis not present

## 2022-03-22 DIAGNOSIS — M545 Low back pain, unspecified: Secondary | ICD-10-CM | POA: Diagnosis not present

## 2022-03-22 DIAGNOSIS — M6281 Muscle weakness (generalized): Secondary | ICD-10-CM | POA: Diagnosis not present

## 2022-03-27 DIAGNOSIS — M545 Low back pain, unspecified: Secondary | ICD-10-CM | POA: Diagnosis not present

## 2022-03-27 DIAGNOSIS — M6281 Muscle weakness (generalized): Secondary | ICD-10-CM | POA: Diagnosis not present

## 2022-03-27 DIAGNOSIS — R2681 Unsteadiness on feet: Secondary | ICD-10-CM | POA: Diagnosis not present

## 2022-03-27 DIAGNOSIS — R296 Repeated falls: Secondary | ICD-10-CM | POA: Diagnosis not present

## 2022-03-29 DIAGNOSIS — M545 Low back pain, unspecified: Secondary | ICD-10-CM | POA: Diagnosis not present

## 2022-03-29 DIAGNOSIS — M6281 Muscle weakness (generalized): Secondary | ICD-10-CM | POA: Diagnosis not present

## 2022-03-29 DIAGNOSIS — R2681 Unsteadiness on feet: Secondary | ICD-10-CM | POA: Diagnosis not present

## 2022-03-29 DIAGNOSIS — R296 Repeated falls: Secondary | ICD-10-CM | POA: Diagnosis not present

## 2022-04-03 DIAGNOSIS — M545 Low back pain, unspecified: Secondary | ICD-10-CM | POA: Diagnosis not present

## 2022-04-03 DIAGNOSIS — M6281 Muscle weakness (generalized): Secondary | ICD-10-CM | POA: Diagnosis not present

## 2022-04-03 DIAGNOSIS — R296 Repeated falls: Secondary | ICD-10-CM | POA: Diagnosis not present

## 2022-04-03 DIAGNOSIS — R2681 Unsteadiness on feet: Secondary | ICD-10-CM | POA: Diagnosis not present

## 2022-04-05 DIAGNOSIS — M545 Low back pain, unspecified: Secondary | ICD-10-CM | POA: Diagnosis not present

## 2022-04-05 DIAGNOSIS — M6281 Muscle weakness (generalized): Secondary | ICD-10-CM | POA: Diagnosis not present

## 2022-04-05 DIAGNOSIS — R2681 Unsteadiness on feet: Secondary | ICD-10-CM | POA: Diagnosis not present

## 2022-04-05 DIAGNOSIS — R296 Repeated falls: Secondary | ICD-10-CM | POA: Diagnosis not present

## 2022-04-09 DIAGNOSIS — M545 Low back pain, unspecified: Secondary | ICD-10-CM | POA: Diagnosis not present

## 2022-04-09 DIAGNOSIS — M6281 Muscle weakness (generalized): Secondary | ICD-10-CM | POA: Diagnosis not present

## 2022-04-09 DIAGNOSIS — R296 Repeated falls: Secondary | ICD-10-CM | POA: Diagnosis not present

## 2022-04-09 DIAGNOSIS — R2681 Unsteadiness on feet: Secondary | ICD-10-CM | POA: Diagnosis not present

## 2022-04-10 DIAGNOSIS — G4733 Obstructive sleep apnea (adult) (pediatric): Secondary | ICD-10-CM | POA: Diagnosis not present

## 2022-04-17 DIAGNOSIS — M545 Low back pain, unspecified: Secondary | ICD-10-CM | POA: Diagnosis not present

## 2022-04-17 DIAGNOSIS — M6281 Muscle weakness (generalized): Secondary | ICD-10-CM | POA: Diagnosis not present

## 2022-04-17 DIAGNOSIS — R2681 Unsteadiness on feet: Secondary | ICD-10-CM | POA: Diagnosis not present

## 2022-04-17 DIAGNOSIS — R296 Repeated falls: Secondary | ICD-10-CM | POA: Diagnosis not present

## 2022-04-19 DIAGNOSIS — M6281 Muscle weakness (generalized): Secondary | ICD-10-CM | POA: Diagnosis not present

## 2022-04-19 DIAGNOSIS — R2681 Unsteadiness on feet: Secondary | ICD-10-CM | POA: Diagnosis not present

## 2022-04-19 DIAGNOSIS — M545 Low back pain, unspecified: Secondary | ICD-10-CM | POA: Diagnosis not present

## 2022-04-19 DIAGNOSIS — R296 Repeated falls: Secondary | ICD-10-CM | POA: Diagnosis not present

## 2022-04-24 DIAGNOSIS — M6281 Muscle weakness (generalized): Secondary | ICD-10-CM | POA: Diagnosis not present

## 2022-04-24 DIAGNOSIS — M545 Low back pain, unspecified: Secondary | ICD-10-CM | POA: Diagnosis not present

## 2022-04-24 DIAGNOSIS — R2681 Unsteadiness on feet: Secondary | ICD-10-CM | POA: Diagnosis not present

## 2022-04-24 DIAGNOSIS — R296 Repeated falls: Secondary | ICD-10-CM | POA: Diagnosis not present

## 2022-04-26 DIAGNOSIS — R2681 Unsteadiness on feet: Secondary | ICD-10-CM | POA: Diagnosis not present

## 2022-04-26 DIAGNOSIS — R296 Repeated falls: Secondary | ICD-10-CM | POA: Diagnosis not present

## 2022-04-26 DIAGNOSIS — M6281 Muscle weakness (generalized): Secondary | ICD-10-CM | POA: Diagnosis not present

## 2022-04-26 DIAGNOSIS — M545 Low back pain, unspecified: Secondary | ICD-10-CM | POA: Diagnosis not present

## 2022-05-01 DIAGNOSIS — R2681 Unsteadiness on feet: Secondary | ICD-10-CM | POA: Diagnosis not present

## 2022-05-01 DIAGNOSIS — M545 Low back pain, unspecified: Secondary | ICD-10-CM | POA: Diagnosis not present

## 2022-05-01 DIAGNOSIS — R296 Repeated falls: Secondary | ICD-10-CM | POA: Diagnosis not present

## 2022-05-01 DIAGNOSIS — M6281 Muscle weakness (generalized): Secondary | ICD-10-CM | POA: Diagnosis not present

## 2022-05-08 DIAGNOSIS — R2681 Unsteadiness on feet: Secondary | ICD-10-CM | POA: Diagnosis not present

## 2022-05-08 DIAGNOSIS — M545 Low back pain, unspecified: Secondary | ICD-10-CM | POA: Diagnosis not present

## 2022-05-08 DIAGNOSIS — M6281 Muscle weakness (generalized): Secondary | ICD-10-CM | POA: Diagnosis not present

## 2022-05-08 DIAGNOSIS — R296 Repeated falls: Secondary | ICD-10-CM | POA: Diagnosis not present

## 2022-05-10 DIAGNOSIS — M6281 Muscle weakness (generalized): Secondary | ICD-10-CM | POA: Diagnosis not present

## 2022-05-10 DIAGNOSIS — R296 Repeated falls: Secondary | ICD-10-CM | POA: Diagnosis not present

## 2022-05-10 DIAGNOSIS — R2681 Unsteadiness on feet: Secondary | ICD-10-CM | POA: Diagnosis not present

## 2022-05-10 DIAGNOSIS — M545 Low back pain, unspecified: Secondary | ICD-10-CM | POA: Diagnosis not present

## 2022-05-15 DIAGNOSIS — M6281 Muscle weakness (generalized): Secondary | ICD-10-CM | POA: Diagnosis not present

## 2022-05-15 DIAGNOSIS — R296 Repeated falls: Secondary | ICD-10-CM | POA: Diagnosis not present

## 2022-05-15 DIAGNOSIS — M545 Low back pain, unspecified: Secondary | ICD-10-CM | POA: Diagnosis not present

## 2022-05-15 DIAGNOSIS — R2681 Unsteadiness on feet: Secondary | ICD-10-CM | POA: Diagnosis not present

## 2022-05-17 DIAGNOSIS — M6281 Muscle weakness (generalized): Secondary | ICD-10-CM | POA: Diagnosis not present

## 2022-05-17 DIAGNOSIS — R2681 Unsteadiness on feet: Secondary | ICD-10-CM | POA: Diagnosis not present

## 2022-05-17 DIAGNOSIS — M545 Low back pain, unspecified: Secondary | ICD-10-CM | POA: Diagnosis not present

## 2022-05-17 DIAGNOSIS — R296 Repeated falls: Secondary | ICD-10-CM | POA: Diagnosis not present

## 2022-05-22 DIAGNOSIS — R2681 Unsteadiness on feet: Secondary | ICD-10-CM | POA: Diagnosis not present

## 2022-05-22 DIAGNOSIS — R296 Repeated falls: Secondary | ICD-10-CM | POA: Diagnosis not present

## 2022-05-22 DIAGNOSIS — M545 Low back pain, unspecified: Secondary | ICD-10-CM | POA: Diagnosis not present

## 2022-05-22 DIAGNOSIS — M6281 Muscle weakness (generalized): Secondary | ICD-10-CM | POA: Diagnosis not present

## 2022-05-24 DIAGNOSIS — R2681 Unsteadiness on feet: Secondary | ICD-10-CM | POA: Diagnosis not present

## 2022-05-24 DIAGNOSIS — M6281 Muscle weakness (generalized): Secondary | ICD-10-CM | POA: Diagnosis not present

## 2022-05-24 DIAGNOSIS — R296 Repeated falls: Secondary | ICD-10-CM | POA: Diagnosis not present

## 2022-05-24 DIAGNOSIS — M545 Low back pain, unspecified: Secondary | ICD-10-CM | POA: Diagnosis not present

## 2022-05-31 DIAGNOSIS — R2681 Unsteadiness on feet: Secondary | ICD-10-CM | POA: Diagnosis not present

## 2022-05-31 DIAGNOSIS — M6281 Muscle weakness (generalized): Secondary | ICD-10-CM | POA: Diagnosis not present

## 2022-05-31 DIAGNOSIS — R296 Repeated falls: Secondary | ICD-10-CM | POA: Diagnosis not present

## 2022-05-31 DIAGNOSIS — M545 Low back pain, unspecified: Secondary | ICD-10-CM | POA: Diagnosis not present

## 2022-06-07 DIAGNOSIS — M5416 Radiculopathy, lumbar region: Secondary | ICD-10-CM | POA: Diagnosis not present

## 2022-06-07 DIAGNOSIS — M6281 Muscle weakness (generalized): Secondary | ICD-10-CM | POA: Diagnosis not present

## 2022-06-07 DIAGNOSIS — I10 Essential (primary) hypertension: Secondary | ICD-10-CM | POA: Diagnosis not present

## 2022-06-07 DIAGNOSIS — Z5181 Encounter for therapeutic drug level monitoring: Secondary | ICD-10-CM | POA: Diagnosis not present

## 2022-06-07 DIAGNOSIS — I4719 Other supraventricular tachycardia: Secondary | ICD-10-CM | POA: Diagnosis not present

## 2022-06-07 DIAGNOSIS — R946 Abnormal results of thyroid function studies: Secondary | ICD-10-CM | POA: Diagnosis not present

## 2022-06-07 DIAGNOSIS — E7800 Pure hypercholesterolemia, unspecified: Secondary | ICD-10-CM | POA: Diagnosis not present

## 2022-06-07 DIAGNOSIS — Z0000 Encounter for general adult medical examination without abnormal findings: Secondary | ICD-10-CM | POA: Diagnosis not present

## 2022-06-07 DIAGNOSIS — R296 Repeated falls: Secondary | ICD-10-CM | POA: Diagnosis not present

## 2022-06-07 DIAGNOSIS — R2681 Unsteadiness on feet: Secondary | ICD-10-CM | POA: Diagnosis not present

## 2022-06-07 DIAGNOSIS — I4729 Other ventricular tachycardia: Secondary | ICD-10-CM | POA: Diagnosis not present

## 2022-06-07 DIAGNOSIS — G4731 Primary central sleep apnea: Secondary | ICD-10-CM | POA: Diagnosis not present

## 2022-06-07 DIAGNOSIS — M545 Low back pain, unspecified: Secondary | ICD-10-CM | POA: Diagnosis not present

## 2022-06-07 DIAGNOSIS — Z79899 Other long term (current) drug therapy: Secondary | ICD-10-CM | POA: Diagnosis not present

## 2022-06-12 DIAGNOSIS — R296 Repeated falls: Secondary | ICD-10-CM | POA: Diagnosis not present

## 2022-06-12 DIAGNOSIS — R2681 Unsteadiness on feet: Secondary | ICD-10-CM | POA: Diagnosis not present

## 2022-06-12 DIAGNOSIS — M6281 Muscle weakness (generalized): Secondary | ICD-10-CM | POA: Diagnosis not present

## 2022-06-12 DIAGNOSIS — M545 Low back pain, unspecified: Secondary | ICD-10-CM | POA: Diagnosis not present

## 2022-06-14 DIAGNOSIS — R2681 Unsteadiness on feet: Secondary | ICD-10-CM | POA: Diagnosis not present

## 2022-06-14 DIAGNOSIS — R296 Repeated falls: Secondary | ICD-10-CM | POA: Diagnosis not present

## 2022-06-14 DIAGNOSIS — M545 Low back pain, unspecified: Secondary | ICD-10-CM | POA: Diagnosis not present

## 2022-06-14 DIAGNOSIS — M6281 Muscle weakness (generalized): Secondary | ICD-10-CM | POA: Diagnosis not present

## 2022-06-19 DIAGNOSIS — M545 Low back pain, unspecified: Secondary | ICD-10-CM | POA: Diagnosis not present

## 2022-06-19 DIAGNOSIS — R2681 Unsteadiness on feet: Secondary | ICD-10-CM | POA: Diagnosis not present

## 2022-06-19 DIAGNOSIS — R296 Repeated falls: Secondary | ICD-10-CM | POA: Diagnosis not present

## 2022-06-19 DIAGNOSIS — M6281 Muscle weakness (generalized): Secondary | ICD-10-CM | POA: Diagnosis not present

## 2022-06-26 DIAGNOSIS — M545 Low back pain, unspecified: Secondary | ICD-10-CM | POA: Diagnosis not present

## 2022-06-26 DIAGNOSIS — R296 Repeated falls: Secondary | ICD-10-CM | POA: Diagnosis not present

## 2022-06-26 DIAGNOSIS — M6281 Muscle weakness (generalized): Secondary | ICD-10-CM | POA: Diagnosis not present

## 2022-06-26 DIAGNOSIS — R2681 Unsteadiness on feet: Secondary | ICD-10-CM | POA: Diagnosis not present

## 2022-07-03 DIAGNOSIS — M545 Low back pain, unspecified: Secondary | ICD-10-CM | POA: Diagnosis not present

## 2022-07-03 DIAGNOSIS — M6281 Muscle weakness (generalized): Secondary | ICD-10-CM | POA: Diagnosis not present

## 2022-07-03 DIAGNOSIS — R296 Repeated falls: Secondary | ICD-10-CM | POA: Diagnosis not present

## 2022-07-03 DIAGNOSIS — R2681 Unsteadiness on feet: Secondary | ICD-10-CM | POA: Diagnosis not present

## 2022-07-14 ENCOUNTER — Other Ambulatory Visit: Payer: Self-pay | Admitting: Cardiology

## 2022-07-16 NOTE — Telephone Encounter (Signed)
Refill to pharmacy 

## 2022-07-17 DIAGNOSIS — R296 Repeated falls: Secondary | ICD-10-CM | POA: Diagnosis not present

## 2022-07-17 DIAGNOSIS — M545 Low back pain, unspecified: Secondary | ICD-10-CM | POA: Diagnosis not present

## 2022-07-17 DIAGNOSIS — R2681 Unsteadiness on feet: Secondary | ICD-10-CM | POA: Diagnosis not present

## 2022-07-17 DIAGNOSIS — M6281 Muscle weakness (generalized): Secondary | ICD-10-CM | POA: Diagnosis not present

## 2022-07-23 DIAGNOSIS — Z0000 Encounter for general adult medical examination without abnormal findings: Secondary | ICD-10-CM | POA: Diagnosis not present

## 2022-07-25 DIAGNOSIS — M545 Low back pain, unspecified: Secondary | ICD-10-CM | POA: Diagnosis not present

## 2022-07-25 DIAGNOSIS — M6281 Muscle weakness (generalized): Secondary | ICD-10-CM | POA: Diagnosis not present

## 2022-07-25 DIAGNOSIS — R296 Repeated falls: Secondary | ICD-10-CM | POA: Diagnosis not present

## 2022-07-25 DIAGNOSIS — R2681 Unsteadiness on feet: Secondary | ICD-10-CM | POA: Diagnosis not present

## 2022-07-31 DIAGNOSIS — R2681 Unsteadiness on feet: Secondary | ICD-10-CM | POA: Diagnosis not present

## 2022-07-31 DIAGNOSIS — R296 Repeated falls: Secondary | ICD-10-CM | POA: Diagnosis not present

## 2022-07-31 DIAGNOSIS — M6281 Muscle weakness (generalized): Secondary | ICD-10-CM | POA: Diagnosis not present

## 2022-07-31 DIAGNOSIS — M545 Low back pain, unspecified: Secondary | ICD-10-CM | POA: Diagnosis not present

## 2022-08-07 DIAGNOSIS — M545 Low back pain, unspecified: Secondary | ICD-10-CM | POA: Diagnosis not present

## 2022-08-07 DIAGNOSIS — M6281 Muscle weakness (generalized): Secondary | ICD-10-CM | POA: Diagnosis not present

## 2022-08-07 DIAGNOSIS — R2681 Unsteadiness on feet: Secondary | ICD-10-CM | POA: Diagnosis not present

## 2022-08-07 DIAGNOSIS — R296 Repeated falls: Secondary | ICD-10-CM | POA: Diagnosis not present

## 2022-08-14 DIAGNOSIS — R2681 Unsteadiness on feet: Secondary | ICD-10-CM | POA: Diagnosis not present

## 2022-08-14 DIAGNOSIS — M6281 Muscle weakness (generalized): Secondary | ICD-10-CM | POA: Diagnosis not present

## 2022-08-14 DIAGNOSIS — M545 Low back pain, unspecified: Secondary | ICD-10-CM | POA: Diagnosis not present

## 2022-08-14 DIAGNOSIS — R296 Repeated falls: Secondary | ICD-10-CM | POA: Diagnosis not present

## 2022-08-15 DIAGNOSIS — L570 Actinic keratosis: Secondary | ICD-10-CM | POA: Diagnosis not present

## 2022-08-15 DIAGNOSIS — L578 Other skin changes due to chronic exposure to nonionizing radiation: Secondary | ICD-10-CM | POA: Diagnosis not present

## 2022-08-16 DIAGNOSIS — M79605 Pain in left leg: Secondary | ICD-10-CM | POA: Diagnosis not present

## 2022-08-21 DIAGNOSIS — M545 Low back pain, unspecified: Secondary | ICD-10-CM | POA: Diagnosis not present

## 2022-08-21 DIAGNOSIS — M6281 Muscle weakness (generalized): Secondary | ICD-10-CM | POA: Diagnosis not present

## 2022-08-21 DIAGNOSIS — R2681 Unsteadiness on feet: Secondary | ICD-10-CM | POA: Diagnosis not present

## 2022-08-21 DIAGNOSIS — R296 Repeated falls: Secondary | ICD-10-CM | POA: Diagnosis not present

## 2022-08-28 DIAGNOSIS — M545 Low back pain, unspecified: Secondary | ICD-10-CM | POA: Diagnosis not present

## 2022-08-28 DIAGNOSIS — R296 Repeated falls: Secondary | ICD-10-CM | POA: Diagnosis not present

## 2022-08-28 DIAGNOSIS — R2681 Unsteadiness on feet: Secondary | ICD-10-CM | POA: Diagnosis not present

## 2022-08-28 DIAGNOSIS — M6281 Muscle weakness (generalized): Secondary | ICD-10-CM | POA: Diagnosis not present

## 2022-09-06 DIAGNOSIS — R296 Repeated falls: Secondary | ICD-10-CM | POA: Diagnosis not present

## 2022-09-06 DIAGNOSIS — M6281 Muscle weakness (generalized): Secondary | ICD-10-CM | POA: Diagnosis not present

## 2022-09-06 DIAGNOSIS — R2681 Unsteadiness on feet: Secondary | ICD-10-CM | POA: Diagnosis not present

## 2022-09-06 DIAGNOSIS — M545 Low back pain, unspecified: Secondary | ICD-10-CM | POA: Diagnosis not present

## 2022-09-11 DIAGNOSIS — M545 Low back pain, unspecified: Secondary | ICD-10-CM | POA: Diagnosis not present

## 2022-09-11 DIAGNOSIS — R2681 Unsteadiness on feet: Secondary | ICD-10-CM | POA: Diagnosis not present

## 2022-09-11 DIAGNOSIS — M6281 Muscle weakness (generalized): Secondary | ICD-10-CM | POA: Diagnosis not present

## 2022-09-11 DIAGNOSIS — R296 Repeated falls: Secondary | ICD-10-CM | POA: Diagnosis not present

## 2022-09-18 DIAGNOSIS — M6281 Muscle weakness (generalized): Secondary | ICD-10-CM | POA: Diagnosis not present

## 2022-09-18 DIAGNOSIS — R296 Repeated falls: Secondary | ICD-10-CM | POA: Diagnosis not present

## 2022-09-18 DIAGNOSIS — M545 Low back pain, unspecified: Secondary | ICD-10-CM | POA: Diagnosis not present

## 2022-09-18 DIAGNOSIS — R2681 Unsteadiness on feet: Secondary | ICD-10-CM | POA: Diagnosis not present

## 2022-10-02 DIAGNOSIS — R2681 Unsteadiness on feet: Secondary | ICD-10-CM | POA: Diagnosis not present

## 2022-10-02 DIAGNOSIS — M6281 Muscle weakness (generalized): Secondary | ICD-10-CM | POA: Diagnosis not present

## 2022-10-02 DIAGNOSIS — R296 Repeated falls: Secondary | ICD-10-CM | POA: Diagnosis not present

## 2022-10-02 DIAGNOSIS — M545 Low back pain, unspecified: Secondary | ICD-10-CM | POA: Diagnosis not present

## 2022-10-16 DIAGNOSIS — R2681 Unsteadiness on feet: Secondary | ICD-10-CM | POA: Diagnosis not present

## 2022-10-16 DIAGNOSIS — M6281 Muscle weakness (generalized): Secondary | ICD-10-CM | POA: Diagnosis not present

## 2022-10-16 DIAGNOSIS — M545 Low back pain, unspecified: Secondary | ICD-10-CM | POA: Diagnosis not present

## 2022-10-16 DIAGNOSIS — R296 Repeated falls: Secondary | ICD-10-CM | POA: Diagnosis not present

## 2022-10-30 DIAGNOSIS — R2681 Unsteadiness on feet: Secondary | ICD-10-CM | POA: Diagnosis not present

## 2022-10-30 DIAGNOSIS — R296 Repeated falls: Secondary | ICD-10-CM | POA: Diagnosis not present

## 2022-10-30 DIAGNOSIS — M545 Low back pain, unspecified: Secondary | ICD-10-CM | POA: Diagnosis not present

## 2022-10-30 DIAGNOSIS — M6281 Muscle weakness (generalized): Secondary | ICD-10-CM | POA: Diagnosis not present

## 2022-11-08 DIAGNOSIS — H34812 Central retinal vein occlusion, left eye, with macular edema: Secondary | ICD-10-CM | POA: Diagnosis not present

## 2022-11-08 DIAGNOSIS — H26492 Other secondary cataract, left eye: Secondary | ICD-10-CM | POA: Diagnosis not present

## 2022-11-13 DIAGNOSIS — M545 Low back pain, unspecified: Secondary | ICD-10-CM | POA: Diagnosis not present

## 2022-11-13 DIAGNOSIS — R296 Repeated falls: Secondary | ICD-10-CM | POA: Diagnosis not present

## 2022-11-13 DIAGNOSIS — M6281 Muscle weakness (generalized): Secondary | ICD-10-CM | POA: Diagnosis not present

## 2022-11-13 DIAGNOSIS — R2681 Unsteadiness on feet: Secondary | ICD-10-CM | POA: Diagnosis not present

## 2022-12-26 DIAGNOSIS — R2681 Unsteadiness on feet: Secondary | ICD-10-CM | POA: Diagnosis not present

## 2022-12-26 DIAGNOSIS — R296 Repeated falls: Secondary | ICD-10-CM | POA: Diagnosis not present

## 2022-12-26 DIAGNOSIS — M6281 Muscle weakness (generalized): Secondary | ICD-10-CM | POA: Diagnosis not present

## 2022-12-26 DIAGNOSIS — M545 Low back pain, unspecified: Secondary | ICD-10-CM | POA: Diagnosis not present

## 2022-12-28 ENCOUNTER — Ambulatory Visit: Payer: Medicare Other | Admitting: Cardiology

## 2023-01-08 ENCOUNTER — Other Ambulatory Visit: Payer: Self-pay | Admitting: Cardiology

## 2023-01-27 IMAGING — CR DG CHEST 2V
2 series · 2 of 2 positions shown · non-contrast
Comparison: March 22, 2017

CLINICAL DATA: Cough and hemoptysis.

EXAM:
CHEST - 2 VIEW

[w chest pa]
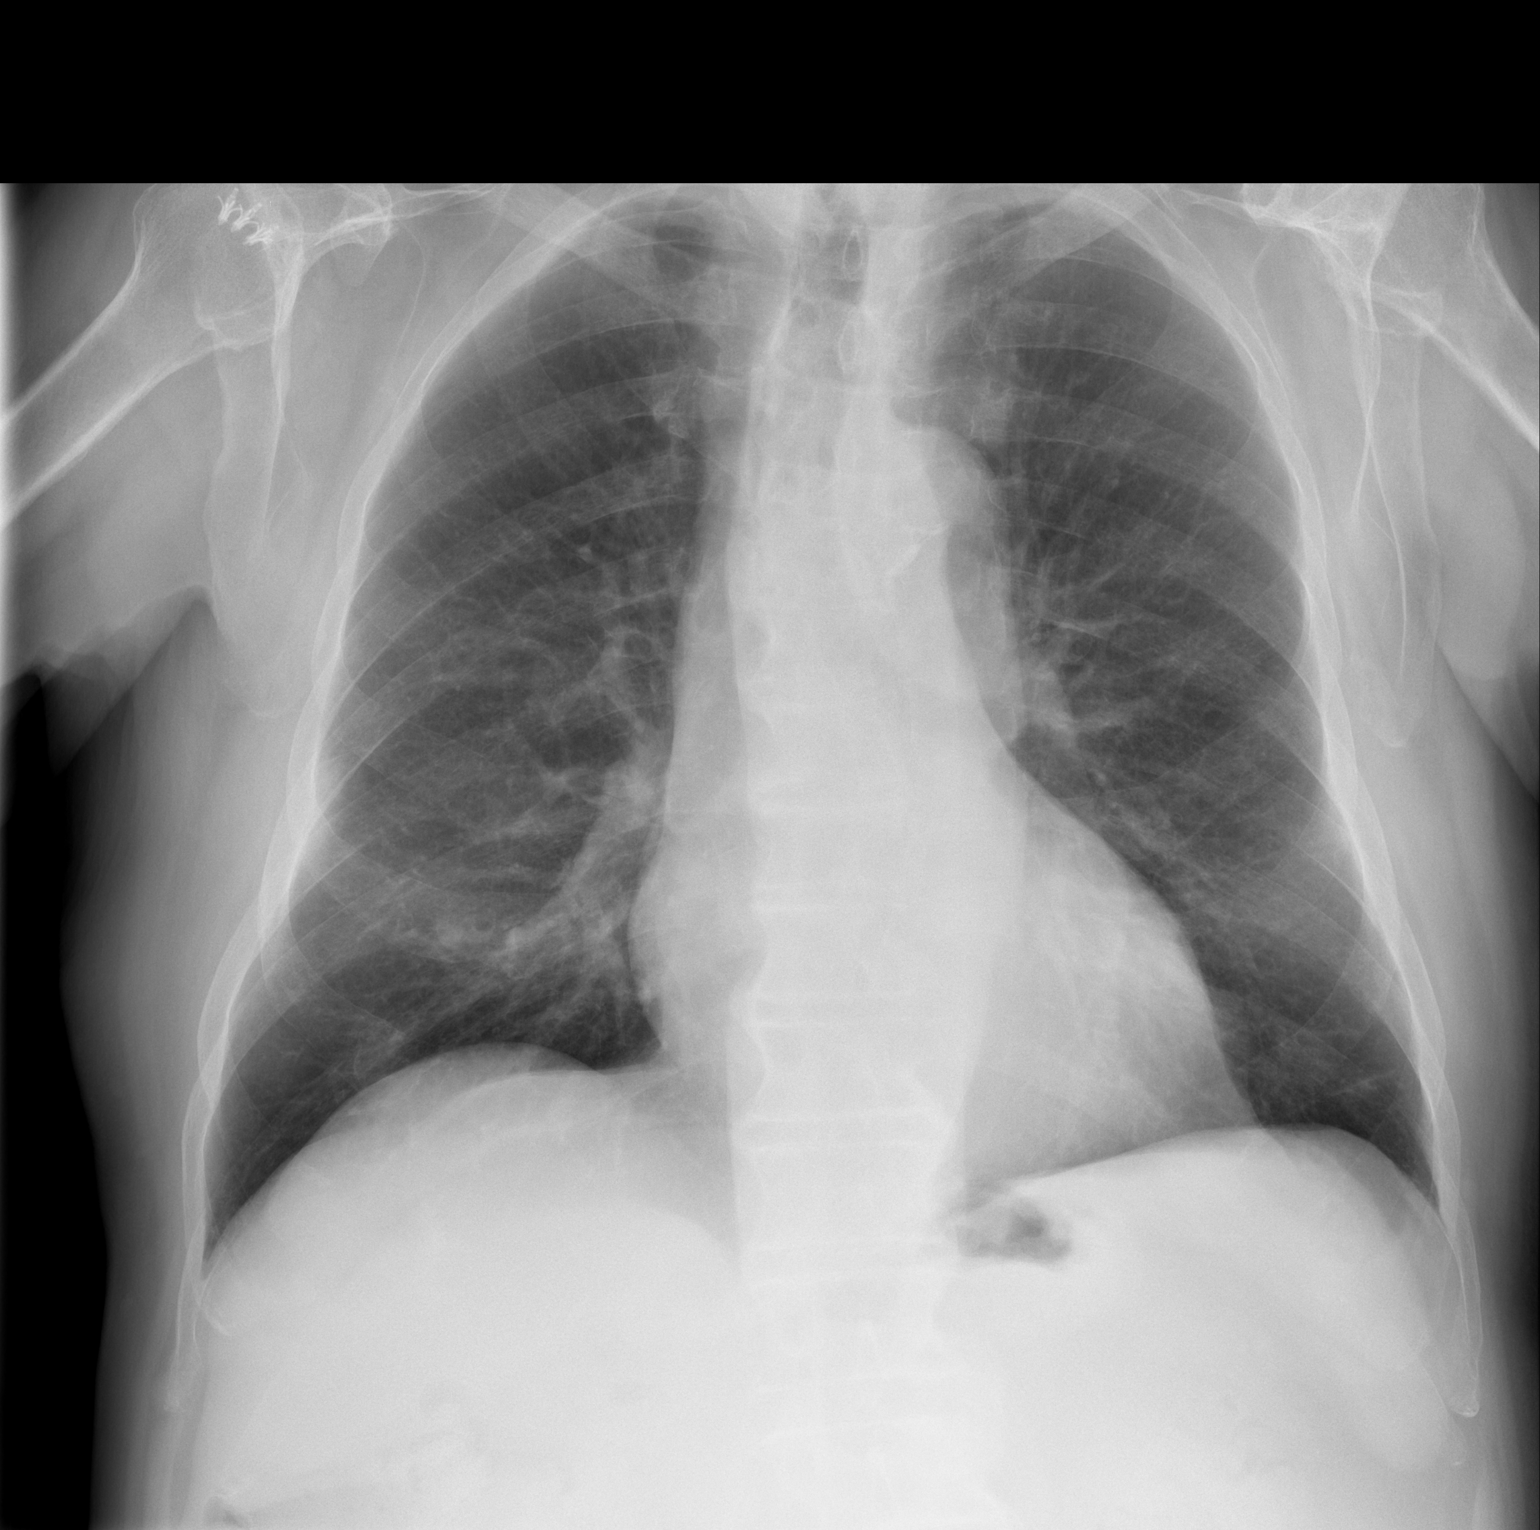

[w chest lat]
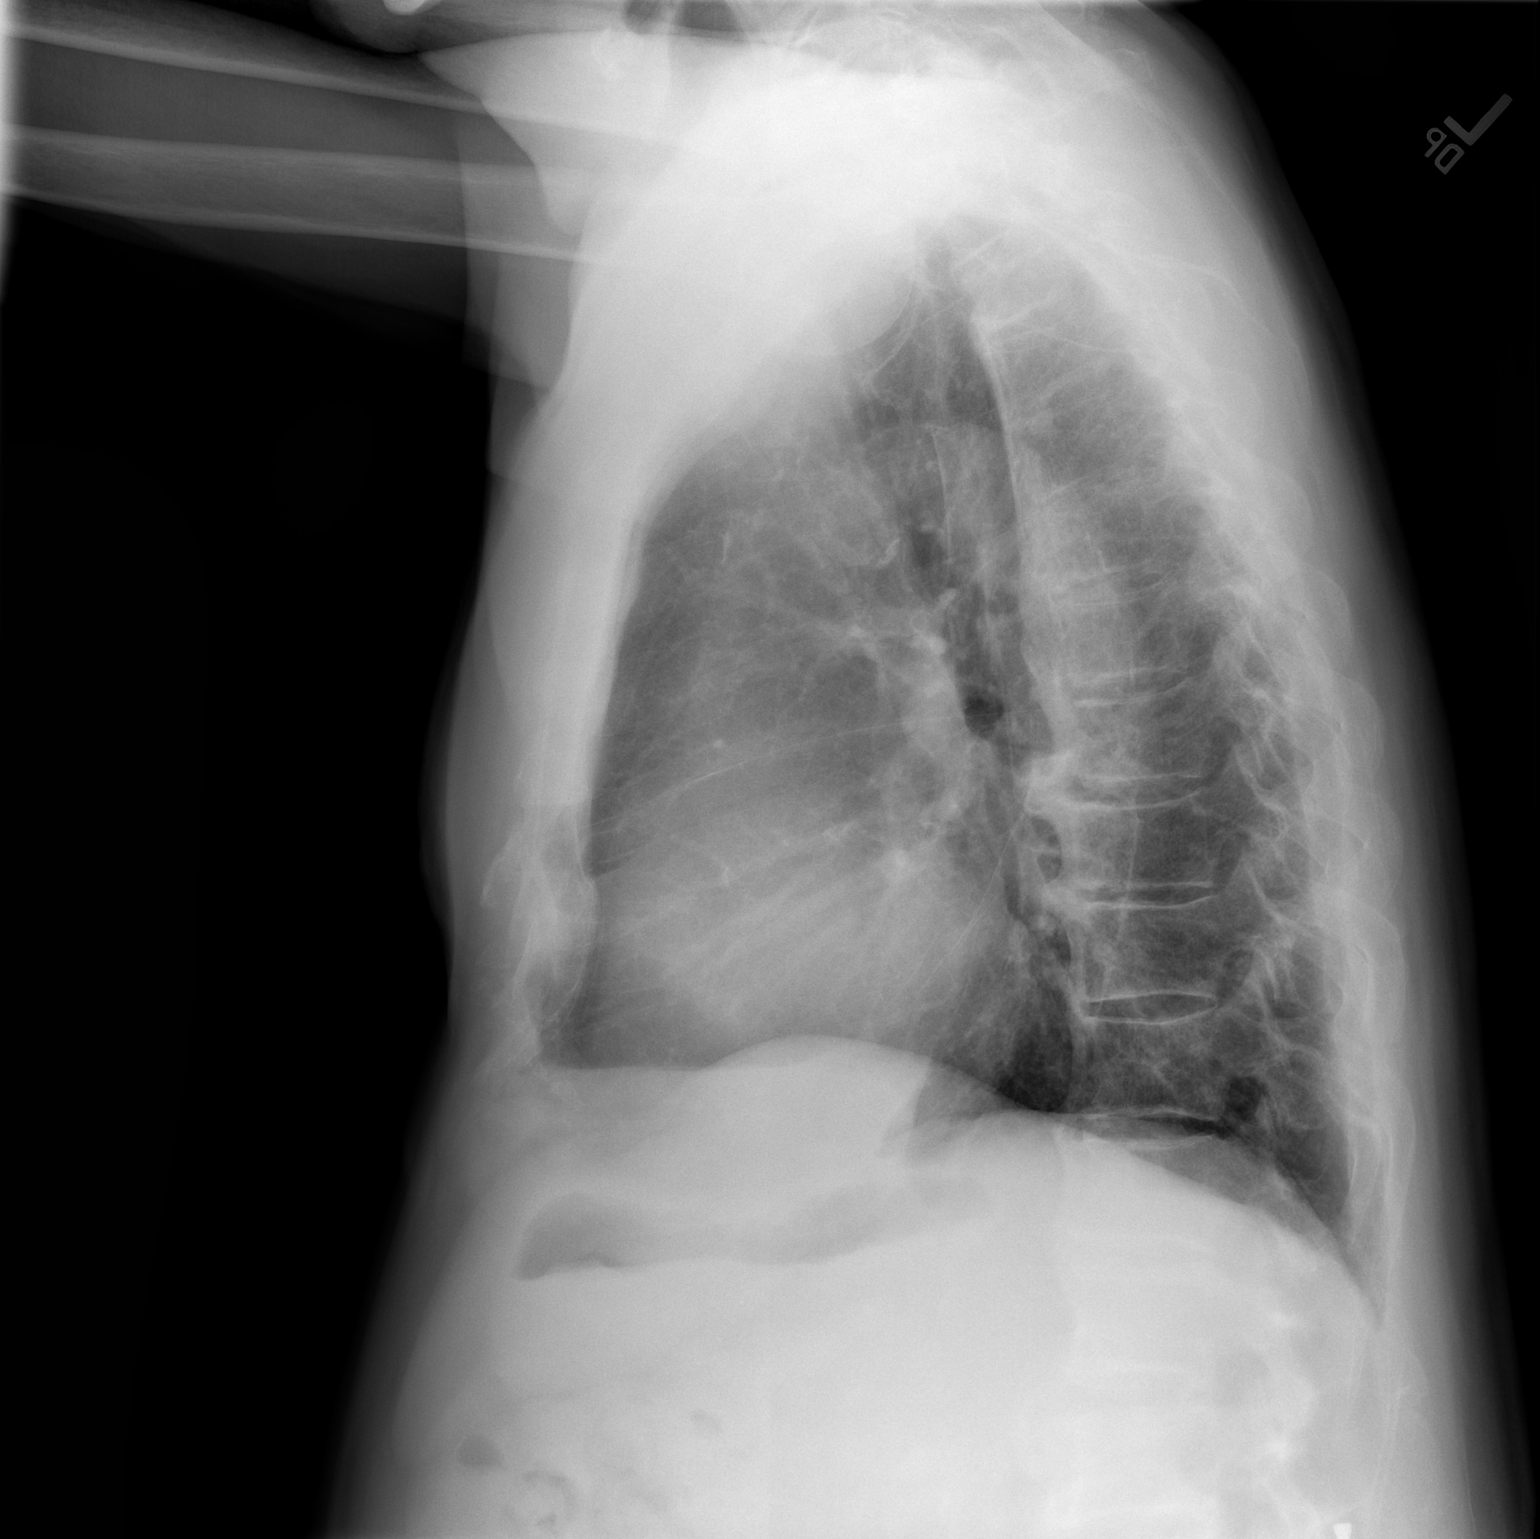

[2 of 2 positions shown; findings below may reference images not displayed]

FINDINGS: The lungs are hyperinflated. There is no evidence of acute
infiltrate, pleural effusion or pneumothorax. An ill-defined nipple
shadow is suspected over the right lung base. The heart size and
mediastinal contours are within normal limits. There is moderate
severity calcification of the aortic arch. Multilevel degenerative
changes are noted throughout the thoracic spine. Radiopaque surgical
pins are seen within the right humeral head.
IMPRESSION: No active cardiopulmonary disease.

## 2023-02-13 DIAGNOSIS — L570 Actinic keratosis: Secondary | ICD-10-CM | POA: Diagnosis not present

## 2023-02-13 DIAGNOSIS — D492 Neoplasm of unspecified behavior of bone, soft tissue, and skin: Secondary | ICD-10-CM | POA: Diagnosis not present

## 2023-02-13 DIAGNOSIS — L568 Other specified acute skin changes due to ultraviolet radiation: Secondary | ICD-10-CM | POA: Diagnosis not present

## 2023-02-13 DIAGNOSIS — C44212 Basal cell carcinoma of skin of right ear and external auricular canal: Secondary | ICD-10-CM | POA: Diagnosis not present

## 2023-03-08 ENCOUNTER — Encounter: Payer: Self-pay | Admitting: Cardiology

## 2023-03-08 ENCOUNTER — Ambulatory Visit: Payer: Medicare Other | Attending: Cardiology | Admitting: Cardiology

## 2023-03-08 VITALS — BP 140/72 | HR 55 | Ht 67.0 in | Wt 156.8 lb

## 2023-03-08 DIAGNOSIS — I10 Essential (primary) hypertension: Secondary | ICD-10-CM | POA: Insufficient documentation

## 2023-03-08 MED ORDER — AMIODARONE HCL 200 MG PO TABS: 200.0000 mg | ORAL_TABLET | Freq: Every day | ORAL | 3 refills | Status: DC

## 2023-03-08 NOTE — Patient Instructions (Signed)
Medication Instructions:  NO CHANGES  *If you need a refill on your cardiac medications before your next appointment, please call your pharmacy*   Follow-Up: At [facility], you and your health needs are our priority.  As part of our continuing mission to provide you with exceptional heart care, we have created designated Provider Care Teams.  These Care Teams include your primary Cardiologist (physician) and Advanced Practice Providers (APPs -  Physician Assistants and Nurse Practitioners) who all work together to provide you with the care you need, when you need it.  We recommend signing up for the patient portal called "MyChart".  Sign up information is provided on this After Visit Summary.  MyChart is used to connect with patients for Virtual Visits (Telemedicine).  Patients are able to view lab/test results, encounter notes, upcoming appointments, etc.  Non-urgent messages can be sent to your provider as well.   To learn more about what you can do with MyChart, go to [email or web address].    Your next appointment:    12 months with Dr. Hilty  

## 2023-03-10 NOTE — Progress Notes (Signed)
Cardiology Office Note:    Date:  03/10/2023   ID:  Aaron Chang, DOB 1930/10/30, MRN [id number]  PCP:  Renford Dills, MD  Cardiologist:  Thomasene Ripple, DO  Electrophysiologist:  None   Referring MD: Renford Dills, MD   " I am doing ok"  History of Present Illness:    Aaron Chang is a 89 y.o. male with a hx of frequent PVCs, nonsustained ventricular tachycardia which was seen on the monitor, sleep apnea is here today for follow-up visit.  Since I saw the patient he has been doing well from a cardiovascular standpoint.  No hospitalizations.  No ED visit.  He is here today with his wife.  Past Medical History:  Diagnosis Date   Cervical radiculopathy 2003   Hx of colonoscopy 2006   2011   Hypercholesterolemia    side effects to lipitor and zocor   Hypertension    Kidney stone 2014   Lumbar radiculopathy 2006   PVC's (premature ventricular contractions)    Severe sleep apnea 08/06/2011    Past Surgical History:  Procedure Laterality Date   CATARACT EXTRACTION, BILATERAL  2017   CERVICAL DISCECTOMY  2011   ELBOW ARTHROPLASTY  2012   FOOT SURGERY Right    LUMBAR FUSION  2020   ROTATOR CUFF REPAIR Right 1996   ROTATOR CUFF REPAIR Left 2015   TARSAL NAVICULAR ARTHODESIS  1940   VASECTOMY  1968    Current Medications: Current Meds  Medication Sig   ascorbic acid (VITAMIN C) 100 MG tablet Take 1 tablet by mouth daily.   Cholecalciferol (VITAMIN D) 125 MCG (5000 UT) CAPS Take 1 tablet by mouth daily.   Flaxseed, Linseed, (FLAX SEED OIL) 1000 MG CAPS Take 1 capsule by mouth See admin instructions.   hydrochlorothiazide (HYDRODIURIL) 12.5 MG tablet Take 1 tablet by mouth daily.   Multiple Vitamin (MULTI-VITAMIN) tablet Take 1 tablet by mouth daily.   Omega-3 Fatty Acids (FISH OIL) 1200 MG CPDR Take 2 capsules by mouth daily.   Zinc 100 MG TABS Take 1 tablet by mouth daily.   [DISCONTINUED] amiodarone (PACERONE) 200 MG tablet Take 1 tablet by mouth once daily      Allergies:   Chocolate flavor, Flomax [tamsulosin], Statins, Atenolol, Atorvastatin, Chocolate, Codeine, Other, and Diazepam   Social History   Socioeconomic History   Marital status: Married    Spouse name: Not on file   Number of children: Not on file   Years of education: Not on file   Highest education level: Not on file  Occupational History   Not on file  Tobacco Use   Smoking status: Never   Smokeless tobacco: Never  Vaping Use   Vaping status: Never Used  Substance and Sexual Activity   Alcohol use: Not Currently   Drug use: Not on file   Sexual activity: Not on file  Other Topics Concern   Not on file  Social History Narrative   Not on file   Social Determinants of Health   Financial Resource Strain: Not on file  Food Insecurity: Not on file  Transportation Needs: No Transportation Needs (01/23/2022)   PRAPARE - Administrator, Civil Service (Medical): No    Lack of Transportation (Non-Medical): No  Physical Activity: Not on file  Stress: No Stress Concern Present (01/23/2022)   [employer] of Occupational Health - Occupational Stress Questionnaire    Feeling of Stress : Only a little  Social Connections: Not on file  Family History: The patient's family history is not on file.  ROS:   Review of Systems  Constitution: Negative for decreased appetite, fever and weight gain.  HENT: Negative for congestion, ear discharge, hoarse voice and sore throat.   Eyes: Negative for discharge, redness, vision loss in right eye and visual halos.  Cardiovascular: Negative for chest pain, dyspnea on exertion, leg swelling, orthopnea and palpitations.  Respiratory: Negative for cough, hemoptysis, shortness of breath and snoring.   Endocrine: Negative for heat intolerance and polyphagia.  Hematologic/Lymphatic: Negative for bleeding problem. Does not bruise/bleed easily.  Skin: Negative for flushing, nail changes, rash and suspicious lesions.   Musculoskeletal: Negative for arthritis, joint pain, muscle cramps, myalgias, neck pain and stiffness.  Gastrointestinal: Negative for abdominal pain, bowel incontinence, diarrhea and excessive appetite.  Genitourinary: Negative for decreased libido, genital sores and incomplete emptying.  Neurological: Negative for brief paralysis, focal weakness, headaches and loss of balance.  Psychiatric/Behavioral: Negative for altered mental status, depression and suicidal ideas.  Allergic/Immunologic: Negative for HIV exposure and persistent infections.    EKGs/Labs/Other Studies Reviewed:    The following studies were reviewed today:   EKG:  The ekg ordered today demonstrates sinus bradycardia   ZIO monitor which was placed on him by his primary care provider on June 21, 2020.  The patient wore the ZIO monitor for 6 days 18 hours.  His minimum heart rate was 46, maximum heart rate was 197, average heart rate was 66.  He had overall 227 episodes of nonsustained ventricular tachycardia with the fastest heartbeat rate at 197 bpm longest 4 beats.  He had a total of 1684 supraventricular tachycardia with the fastest heart rate at 184 bpm and the longest episode lasting 18.6 seconds.  There were frequent PACs 5.7%.  Frequent PVCs 12%.  No pauses, no AV block, no atrial fibrillation.   Transthoracic echocardiogram August 11, 2020 IMPRESSIONS     1. Left ventricular ejection fraction, by estimation, is 50 to 55%. The  left ventricle has low normal function. The left ventricle has no regional  wall motion abnormalities. There is moderate concentric left ventricular  hypertrophy. Left ventricular  diastolic parameters are consistent with Grade I diastolic dysfunction  (impaired relaxation).   2. Right ventricular systolic function is normal. The right ventricular  size is normal. There is mildly elevated pulmonary artery systolic  pressure.   3. The mitral valve is normal in structure. Mild mitral  valve  regurgitation. No evidence of mitral stenosis.   4. The aortic valve is tricuspid. Aortic valve regurgitation is mild.  Mild aortic valve sclerosis is present, with no evidence of aortic valve  stenosis.   5. There is borderline dilatation of the ascending aorta, measuring 36  mm.   6. The inferior vena cava is normal in size with greater than 50%  respiratory variability, suggesting right atrial pressure of 3 mmHg.   FINDINGS   Left Ventricle: Left ventricular ejection fraction, by estimation, is 50  to 55%. The left ventricle has low normal function. The left ventricle has  no regional wall motion abnormalities. The left ventricular internal  cavity size was normal in size.  There is moderate concentric left ventricular hypertrophy. Abnormal  (paradoxical) septal motion, consistent with left bundle branch block.  Left ventricular diastolic parameters are consistent with Grade I  diastolic dysfunction (impaired relaxation).  Indeterminate filling pressures.   Right Ventricle: The right ventricular size is normal. No increase in  right ventricular wall thickness. Right ventricular systolic  function is  normal. There is mildly elevated pulmonary artery systolic pressure. The  tricuspid regurgitant velocity is 2.90   m/s, and with an assumed right atrial pressure of 3 mmHg, the estimated  right ventricular systolic pressure is 36.6 mmHg.   Left Atrium: Left atrial size was normal in size.   Right Atrium: Right atrial size was normal in size.   Pericardium: There is no evidence of pericardial effusion.   Mitral Valve: The mitral valve is normal in structure. Mild mitral valve  regurgitation. No evidence of mitral valve stenosis.   Tricuspid Valve: The tricuspid valve is normal in structure. Tricuspid  valve regurgitation is mild . No evidence of tricuspid stenosis.   Aortic Valve: The aortic valve is tricuspid. Aortic valve regurgitation is  mild. Mild aortic valve  sclerosis is present, with no evidence of aortic  valve stenosis.   Pulmonic Valve: The pulmonic valve was normal in structure. Pulmonic valve  regurgitation is not visualized. No evidence of pulmonic stenosis.   Aorta: The aortic root is normal in size and structure and the aortic arch  was not well visualized. There is borderline dilatation of the ascending  aorta, measuring 36 mm.   Venous: The pulmonary veins were not well visualized. The inferior vena  cava is normal in size with greater than 50% respiratory variability,  suggesting right atrial pressure of 3 mmHg.   IAS/Shunts: No atrial level shunt detected by color flow Doppler.   Recent Labs: No results found for requested labs within last 365 days.  Recent Lipid Panel No results found for: "CHOL", "TRIG", "HDL", "CHOLHDL", "VLDL", "LDLCALC", "LDLDIRECT"  Physical Exam:    VS:  BP (!) 140/72 (BP Location: Right Arm, Patient Position: Sitting, Cuff Size: Normal)   Pulse (!) 55   Ht 5\' 7"  (1.702 m)   Wt 156 lb 12.8 oz (71.1 kg)   SpO2 94%   BMI 24.56 kg/m     Wt Readings from Last 3 Encounters:  03/08/23 156 lb 12.8 oz (71.1 kg)  12/13/21 150 lb 9.6 oz (68.3 kg)  12/05/20 155 lb (70.3 kg)     GEN: Well nourished, well developed in no acute distress HEENT: Normal NECK: No JVD; No carotid bruits LYMPHATICS: No lymphadenopathy CARDIAC: S1S2 noted,RRR, no murmurs, rubs, gallops RESPIRATORY:  Clear to auscultation without rales, wheezing or rhonchi  ABDOMEN: Soft, non-tender, non-distended, +bowel sounds, no guarding. EXTREMITIES: No edema, No cyanosis, no clubbing MUSCULOSKELETAL:  No deformity  SKIN: Warm and dry NEUROLOGIC:  Alert and oriented x 3, non-focal PSYCHIATRIC:  Normal affect, good insight  ASSESSMENT:    1. Primary hypertension    PLAN:     1.  He is doing well from a cardiovascular standpoint.  No change in medications made today.     The patient is in agreement with the above plan. The  patient left the office in stable condition.  The patient will follow up in   Medication Adjustments/Labs and Tests Ordered: Current medicines are reviewed at length with the patient today.  Concerns regarding medicines are outlined above.  Orders Placed This Encounter  Procedures   EKG 12-Lead   Meds ordered this encounter  Medications   amiodarone (PACERONE) 200 MG tablet    Sig: Take 1 tablet (200 mg total) by mouth daily.    Dispense:  90 tablet    Refill:  3    Patient Instructions  Medication Instructions:  NO CHANGES  *If you need a refill on your cardiac  medications before your next appointment, please call your pharmacy*   Follow-Up: At [facility], you and your health needs are our priority.  As part of our continuing mission to provide you with exceptional heart care, we have created designated Provider Care Teams.  These Care Teams include your primary Cardiologist (physician) and Advanced Practice Providers (APPs -  Physician Assistants and Nurse Practitioners) who all work together to provide you with the care you need, when you need it.  We recommend signing up for the patient portal called "MyChart".  Sign up information is provided on this After Visit Summary.  MyChart is used to connect with patients for Virtual Visits (Telemedicine).  Patients are able to view lab/test results, encounter notes, upcoming appointments, etc.  Non-urgent messages can be sent to your provider as well.   To learn more about what you can do with MyChart, go to [email or web address].    Your next appointment:    12 months with Dr. Rennis Golden    Adopting a Healthy Lifestyle.  Know what a healthy weight is for you (roughly BMI <25) and aim to maintain this   Aim for 7+ servings of fruits and vegetables daily   65-80+ fluid ounces of water or unsweet tea for healthy kidneys   Limit to max 1 drink of alcohol per day; avoid smoking/tobacco   Limit animal fats in diet for  cholesterol and heart health - choose grass fed whenever available   Avoid highly processed foods, and foods high in saturated/trans fats   Aim for low stress - take time to unwind and care for your mental health   Aim for 150 min of moderate intensity exercise weekly for heart health, and weights twice weekly for bone health   Aim for 7-9 hours of sleep daily   When it comes to diets, agreement about the perfect plan isnt easy to find, even among the experts. Experts at the [facility] of [employer] developed an idea known as the Healthy Eating Plate. Just imagine a plate divided into logical, healthy portions.   The emphasis is on diet quality:   Load up on vegetables and fruits - one-half of your plate: Aim for color and variety, and remember that potatoes dont count.   Go for whole grains - one-quarter of your plate: Whole wheat, barley, wheat berries, quinoa, oats, brown rice, and foods made with them. If you want pasta, go with whole wheat pasta.   Protein power - one-quarter of your plate: Fish, chicken, beans, and nuts are all healthy, versatile protein sources. Limit red meat.   The diet, however, does go beyond the plate, offering a few other suggestions.   Use healthy plant oils, such as olive, canola, soy, corn, sunflower and peanut. Check the labels, and avoid partially hydrogenated oil, which have unhealthy trans fats.   If youre thirsty, drink water. Coffee and tea are good in moderation, but skip sugary drinks and limit milk and dairy products to one or two daily servings.   The type of carbohydrate in the diet is more important than the amount. Some sources of carbohydrates, such as vegetables, fruits, whole grains, and beans-are healthier than others.   Finally, stay active  Signed, Thomasene Ripple, DO  03/10/2023 12:58 AM    [facility]

## 2023-03-11 DIAGNOSIS — C44212 Basal cell carcinoma of skin of right ear and external auricular canal: Secondary | ICD-10-CM | POA: Diagnosis not present

## 2023-05-07 DIAGNOSIS — L988 Other specified disorders of the skin and subcutaneous tissue: Secondary | ICD-10-CM | POA: Diagnosis not present

## 2023-05-07 DIAGNOSIS — C44212 Basal cell carcinoma of skin of right ear and external auricular canal: Secondary | ICD-10-CM | POA: Diagnosis not present

## 2023-05-07 DIAGNOSIS — L814 Other melanin hyperpigmentation: Secondary | ICD-10-CM | POA: Diagnosis not present

## 2023-10-11 ENCOUNTER — Ambulatory Visit: Payer: Self-pay | Admitting: Podiatry

## 2023-10-11 DIAGNOSIS — B353 Tinea pedis: Secondary | ICD-10-CM

## 2023-10-11 MED ORDER — CICLOPIROX 0.77 % EX GEL: CUTANEOUS | 2 refills | Status: DC

## 2023-10-11 NOTE — Progress Notes (Unsigned)
     Chief Complaint  Patient presents with   Skin Issue    Bilateral redness between his toes. Wife has tried OTC athletes foot cream with no improvement. She is with him today. He is not diabetic and takes Fish oil.    HPI: 89 y.o. male presents today with a family member with concern of rash between the toes.  The right forefoot is slightly more involved than the left.  He notes that his feet are sweaty at the end of the day.  Past Medical History:  Diagnosis Date   Cervical radiculopathy 2003   Hx of colonoscopy 2006   2011   Hypercholesterolemia    side effects to lipitor and zocor   Hypertension    Kidney stone 2014   Lumbar radiculopathy 2006   PVC's (premature ventricular contractions)    Severe sleep apnea 08/06/2011   Past Surgical History:  Procedure Laterality Date   CATARACT EXTRACTION, BILATERAL  2017   CERVICAL DISCECTOMY  2011   ELBOW ARTHROPLASTY  2012   FOOT SURGERY Right    LUMBAR FUSION  2020   ROTATOR CUFF REPAIR Right 1996   ROTATOR CUFF REPAIR Left 2015   TARSAL NAVICULAR ARTHODESIS  1940   VASECTOMY  1968   Allergies  Allergen Reactions   Chocolate Flavoring Agent (Non-Screening)     Other reaction(s): severe migraine   Flomax [Tamsulosin]     Nightmare   Statins     Muscle pains RAISE BLOOD PRESSURE   Atenolol Other (See Comments)    fatigue   Atorvastatin Other (See Comments)    Other reaction(s): myalgia   Chocolate Other (See Comments)    Other reaction(s): headache   Codeine    Other Nausea Only    Other reaction(s): Unknown   Diazepam Other (See Comments)    hyper    Physical Exam: Palpable pedal pulses noted.  There is localized erythema and peeling skin to the interspaces bilateral as well as the sulcus of the right forefoot.  Toenails are 3 mm thick with yellow discoloration, subungual debris, distal onycholysis, pain with compression consistent with onychomycosis.  Epicritic sensation is intact  Assessment/Plan of Care: 1.  Tinea pedis of both feet      Meds ordered this encounter  Medications   Ciclopirox 0.77 % gel    Sig: Apply to both feet and between toes bid for 4 weeks.    Dispense:  45 g    Refill:  2   Castellani's paint was applied to the interspaces today.  Recommended over-the-counter Zeasorb-AF powder to apply into his socks and shoes for moisture management with mild antifungal action.  Will send in prescription ciclopirox gel to be applied between the toes for up to 4 weeks.  He can apply this twice daily.  Will have the patient follow-up in 2 to 3 weeks for recheck of the tinea pedis and also for debridement of the mycotic toenails.   Joe Murders, DPM, FACFAS [facility]     [address] [place], [place] [address]                Office [phone]  Fax [phone]

## 2023-10-14 ENCOUNTER — Encounter: Payer: Self-pay | Admitting: Podiatry

## 2023-10-24 ENCOUNTER — Ambulatory Visit: Admitting: Podiatry

## 2023-11-07 ENCOUNTER — Ambulatory Visit: Admitting: Podiatry

## 2023-11-07 DIAGNOSIS — B353 Tinea pedis: Secondary | ICD-10-CM | POA: Diagnosis not present

## 2023-11-07 DIAGNOSIS — B351 Tinea unguium: Secondary | ICD-10-CM

## 2023-11-07 DIAGNOSIS — M79675 Pain in left toe(s): Secondary | ICD-10-CM | POA: Diagnosis not present

## 2023-11-07 DIAGNOSIS — M79674 Pain in right toe(s): Secondary | ICD-10-CM

## 2023-11-07 MED ORDER — CICLOPIROX 0.77 % EX GEL: CUTANEOUS | 2 refills | Status: DC

## 2023-11-07 NOTE — Progress Notes (Unsigned)
 Nails x10.  Interdigital tinea a little better.  Renewed the ciclopirox 

## 2023-12-16 ENCOUNTER — Telehealth: Payer: Self-pay | Admitting: Urology

## 2023-12-16 NOTE — Telephone Encounter (Signed)
 Pts wife called saying he has been taking Ciclopirox  0.77 % gel for a while and doesn't think it is helping, wants to know if there is anything else that can be sent in.  Pharmacy [facility] [address] [place], [place] - [address] [phone]   Call back # [phone]  Thanks!

## 2023-12-19 ENCOUNTER — Other Ambulatory Visit: Payer: Self-pay | Admitting: Podiatry

## 2023-12-19 MED ORDER — OXISTAT 1 % EX LOTN: TOPICAL_LOTION | Freq: Two times a day (BID) | CUTANEOUS | 3 refills | Status: AC

## 2023-12-19 NOTE — Progress Notes (Signed)
 Patient's wife stop by today to clarify that it is the ciclopirox  gel that is not working well between his toes.  He is still getting peeling and cracks between the toes.  Will switch over to Oxistat  lotion to apply between the toes twice daily.

## 2024-02-06 ENCOUNTER — Ambulatory Visit: Admitting: Podiatry

## 2024-03-29 ENCOUNTER — Other Ambulatory Visit: Payer: Self-pay | Admitting: Podiatry

## 2024-04-12 ENCOUNTER — Other Ambulatory Visit: Payer: Self-pay | Admitting: Cardiology

## 2024-04-22 ENCOUNTER — Other Ambulatory Visit: Payer: Self-pay | Admitting: Cardiology

## 2024-04-23 ENCOUNTER — Other Ambulatory Visit: Payer: Self-pay | Admitting: Cardiology

## 2024-04-23 MED ORDER — AMIODARONE HCL 200 MG PO TABS: 200.0000 mg | ORAL_TABLET | Freq: Every day | ORAL | 1 refills | Status: AC

## 2024-04-23 NOTE — Telephone Encounter (Signed)
 Wife called requesting refill of Amiodarone  Refilled as requested   Message to scheduling team to reach out to get follow with Dr Sheena scheduled

## 2024-07-24 ENCOUNTER — Ambulatory Visit: Admitting: Cardiology
